# Patient Record
Sex: Female | Born: 1970 | Race: Black or African American | Hispanic: No | Marital: Single | Smoking: Never smoker
Health system: Southern US, Community
[De-identification: ages and names within clinical notes are randomized; demographics above are authoritative.]

## PROBLEM LIST (undated history)

## (undated) DIAGNOSIS — I10 Essential (primary) hypertension: Secondary | ICD-10-CM

## (undated) DIAGNOSIS — M1990 Unspecified osteoarthritis, unspecified site: Secondary | ICD-10-CM

## (undated) HISTORY — DX: Essential (primary) hypertension: I10

## (undated) HISTORY — DX: Unspecified osteoarthritis, unspecified site: M19.90

---

## 2019-07-15 DIAGNOSIS — Z7189 Other specified counseling: Secondary | ICD-10-CM | POA: Diagnosis not present

## 2019-07-15 DIAGNOSIS — Z20828 Contact with and (suspected) exposure to other viral communicable diseases: Secondary | ICD-10-CM | POA: Diagnosis not present

## 2020-03-21 ENCOUNTER — Other Ambulatory Visit: Payer: Self-pay

## 2020-03-21 ENCOUNTER — Ambulatory Visit (INDEPENDENT_AMBULATORY_CARE_PROVIDER_SITE_OTHER): Payer: Federal, State, Local not specified - PPO

## 2020-03-21 ENCOUNTER — Encounter: Payer: Self-pay | Admitting: Internal Medicine

## 2020-03-21 ENCOUNTER — Ambulatory Visit: Payer: Federal, State, Local not specified - PPO | Admitting: Internal Medicine

## 2020-03-21 VITALS — BP 146/102 | HR 76 | Temp 98.4°F | Resp 16 | Ht 67.0 in | Wt 271.8 lb

## 2020-03-21 DIAGNOSIS — M1732 Unilateral post-traumatic osteoarthritis, left knee: Secondary | ICD-10-CM | POA: Diagnosis not present

## 2020-03-21 DIAGNOSIS — M25562 Pain in left knee: Secondary | ICD-10-CM

## 2020-03-21 DIAGNOSIS — E559 Vitamin D deficiency, unspecified: Secondary | ICD-10-CM

## 2020-03-21 DIAGNOSIS — Z1159 Encounter for screening for other viral diseases: Secondary | ICD-10-CM

## 2020-03-21 DIAGNOSIS — Z124 Encounter for screening for malignant neoplasm of cervix: Secondary | ICD-10-CM | POA: Insufficient documentation

## 2020-03-21 DIAGNOSIS — Z0001 Encounter for general adult medical examination with abnormal findings: Secondary | ICD-10-CM

## 2020-03-21 DIAGNOSIS — G8929 Other chronic pain: Secondary | ICD-10-CM | POA: Diagnosis not present

## 2020-03-21 DIAGNOSIS — Z0000 Encounter for general adult medical examination without abnormal findings: Secondary | ICD-10-CM | POA: Diagnosis not present

## 2020-03-21 DIAGNOSIS — M1712 Unilateral primary osteoarthritis, left knee: Secondary | ICD-10-CM | POA: Diagnosis not present

## 2020-03-21 DIAGNOSIS — Z23 Encounter for immunization: Secondary | ICD-10-CM | POA: Diagnosis not present

## 2020-03-21 DIAGNOSIS — I10 Essential (primary) hypertension: Secondary | ICD-10-CM

## 2020-03-21 LAB — CBC WITH DIFFERENTIAL/PLATELET
Absolute Monocytes: 518 cells/uL (ref 200–950)
Basophils Absolute: 49 cells/uL (ref 0–200)
Basophils Relative: 0.6 %
Eosinophils Absolute: 211 cells/uL (ref 15–500)
Eosinophils Relative: 2.6 %
HCT: 39.7 % (ref 35.0–45.0)
Hemoglobin: 13.3 g/dL (ref 11.7–15.5)
Lymphs Abs: 2333 cells/uL (ref 850–3900)
MCH: 28.5 pg (ref 27.0–33.0)
MCHC: 33.5 g/dL (ref 32.0–36.0)
MCV: 85.2 fL (ref 80.0–100.0)
MPV: 10.7 fL (ref 7.5–12.5)
Monocytes Relative: 6.4 %
Neutro Abs: 4990 cells/uL (ref 1500–7800)
Neutrophils Relative %: 61.6 %
Platelets: 230 10*3/uL (ref 140–400)
RBC: 4.66 10*6/uL (ref 3.80–5.10)
RDW: 13.3 % (ref 11.0–15.0)
Total Lymphocyte: 28.8 %
WBC: 8.1 10*3/uL (ref 3.8–10.8)

## 2020-03-21 NOTE — Progress Notes (Signed)
Subjective:  Patient ID: Kelly Vega, female    DOB: January 26, 1971  Age: 49 y.o. MRN: [id number]  CC: Annual Exam, Hypertension, and Osteoarthritis  This visit occurred during the SARS-CoV-2 public health emergency.  Safety protocols were in place, including screening questions prior to the visit, additional usage of staff PPE, and extensive cleaning of exam room while observing appropriate contact time as indicated for disinfecting solutions.   NEW TO ME  HPI Kelly Vega presents for a CPX.  She complains of a 1 year history of worsening left knee pain with decreased range of motion.  She has had several knee injuries throughout her life.  She denies any recent trauma or injury.  The knee never gets swollen.  None of her other joints bother her.  She has not been taking anything to control the discomfort.  She is active and denies any recent episodes of headache, blurred vision, chest pain, shortness of breath, palpitations, edema, or fatigue.  History Kelly Vega has no past medical history on file.   She has no past surgical history on file.   Her family history includes Alcoholism in her father; Breast cancer in her mother; CAD in her father; Diabetes in her mother; Hypertension in her father and mother.She reports that she has never smoked. She has never used smokeless tobacco. She reports previous alcohol use. She reports previous drug use.  No outpatient medications prior to visit.   No facility-administered medications prior to visit.    ROS Review of Systems  Constitutional: Negative.  Negative for appetite change, chills, fatigue and fever.  HENT: Negative.   Eyes: Negative.   Respiratory: Negative for cough, chest tightness, shortness of breath and wheezing.   Cardiovascular: Negative for chest pain, palpitations and leg swelling.  Gastrointestinal: Negative for abdominal pain, constipation, diarrhea, nausea and vomiting.  Endocrine: Negative.   Genitourinary: Negative.     Musculoskeletal: Positive for arthralgias. Negative for back pain, myalgias and neck pain.  Skin: Negative.  Negative for color change, pallor and rash.  Neurological: Negative.  Negative for dizziness, weakness, numbness and headaches.  Hematological: Negative for adenopathy. Does not bruise/bleed easily.  Psychiatric/Behavioral: Negative.     Objective:  BP (!) 146/102 Comment: BP (R) 146/102 (L) 144/92  Pulse 76   Temp 98.4 F (36.9 C) (Oral)   Resp 16   Ht 5\' 7"  (1.702 m)   Wt 271 lb 12.8 oz (123.3 kg)   SpO2 98%   BMI 42.57 kg/m   Physical Exam Vitals reviewed.  Constitutional:      Appearance: Normal appearance. She is obese.  HENT:     Nose: Nose normal.     Mouth/Throat:     Mouth: Mucous membranes are moist.  Eyes:     General: No scleral icterus.    Conjunctiva/sclera: Conjunctivae normal.  Cardiovascular:     Rate and Rhythm: Normal rate and regular rhythm.     Heart sounds: Normal heart sounds, S1 normal and S2 normal. No murmur heard.      Comments: EKG- NSR, 68 bpm Low voltage c/w body habitus No LVH Otherwise normal EKG Pulmonary:     Effort: Pulmonary effort is normal.     Breath sounds: No stridor. No wheezing, rhonchi or rales.  Abdominal:     General: Abdomen is protuberant. Bowel sounds are normal. There is no distension.     Palpations: Abdomen is soft. There is no hepatomegaly, splenomegaly or mass.  Musculoskeletal:        General:  Deformity (DJD) present.     Cervical back: Neck supple.     Right lower leg: No edema.     Left lower leg: No edema.  Lymphadenopathy:     Cervical: No cervical adenopathy.  Skin:    General: Skin is warm and dry.     Coloration: Skin is not pale.  Neurological:     General: No focal deficit present.     Mental Status: She is alert.  Psychiatric:        Mood and Affect: Mood normal.        Behavior: Behavior normal.      Lab Results  Component Value Date   WBC 8.1 03/21/2020   HGB 13.3 03/21/2020    HCT 39.7 03/21/2020   PLT 230 03/21/2020   GLUCOSE 87 03/21/2020   CHOL 178 03/21/2020   TRIG 91 03/21/2020   HDL 48 (L) 03/21/2020   LDLCALC 111 (H) 03/21/2020   ALT 16 03/21/2020   AST 19 03/21/2020   NA 139 03/21/2020   K 4.2 03/21/2020   CL 105 03/21/2020   CREATININE 0.79 03/21/2020   BUN 12 03/21/2020   CO2 27 03/21/2020   TSH 1.44 03/21/2020   HGBA1C 5.7 (H) 03/21/2020   DG Knee Complete 4 Views Left  Result Date: 03/21/2020 CLINICAL DATA:  Generalized chronic left knee pain. No reported injury. EXAM: LEFT KNEE - COMPLETE 4+ VIEW COMPARISON:  None. FINDINGS: No fracture, joint effusion or dislocation. Small superior and inferior left patellar enthesophytes. Mild tricompartmental left knee osteoarthritis, most prominent in the lateral compartment. No focal osseous lesions. No radiopaque foreign bodies. IMPRESSION: Mild tricompartmental left knee osteoarthritis, most prominent in the lateral compartment. Electronically Signed   By: Delbert Phenix M.D.   On: 03/21/2020 16:36     Assessment & Plan:   [place] was seen today for annual exam, hypertension and osteoarthritis.  Diagnoses and all orders for this visit:  Routine general medical examination at a health care facility- Exam completed, labs reviewed- She does not have an elevated ASCVD risk or so I did not recommend a statin for CV risk reduction, vaccines reviewed and updated, she is referred for cervical cancer screening, patient education was given. -     Lipid panel; Future -     HIV Antibody (routine testing w rflx); Future -     HIV Antibody (routine testing w rflx) -     Lipid panel  Need for hepatitis C screening test -     Hepatitis C antibody; Future -     Hepatitis C antibody  Cervical cancer screening -     Ambulatory referral to Gynecology  Morbid obesity (HCC)- Labs are negative for secondary causes. -     Hemoglobin A1c; Future -     Hepatic function panel; Future -     TSH; Future -     TSH -      Hepatic function panel -     Hemoglobin A1c  Essential hypertension- Her labs are negative for secondary causes or endorgan damage.  I recommended that she treat this with a thiazide diuretic in addition to lifestyle modifications. -     CBC with Differential/Platelet; Future -     BASIC METABOLIC PANEL WITH GFR; Future -     Urinalysis, Routine w reflex microscopic; Future -     VITAMIN D 25 Hydroxy (Vit-D Deficiency, Fractures); Future -     hCG, quantitative, pregnancy; Future -     hCG,  quantitative, pregnancy -     VITAMIN D 25 Hydroxy (Vit-D Deficiency, Fractures) -     Urinalysis, Routine w reflex microscopic -     BASIC METABOLIC PANEL WITH GFR -     CBC with Differential/Platelet -     EKG 12-Lead -     indapamide (LOZOL) 1.25 MG tablet; Take 1 tablet (1.25 mg total) by mouth daily.  Chronic pain of left knee -     DG Knee Complete 4 Views Left; Future -     Ambulatory referral to Sports Medicine  Post-traumatic osteoarthritis of left knee -     Ambulatory referral to Sports Medicine  Vitamin D insufficiency -     Cholecalciferol 50 MCG (2000 UT) TABS; Take 1 tablet (2,000 Units total) by mouth daily.  Other orders -     Tdap vaccine greater than or equal to 7yo IM   I am having Farrell Ours start on Cholecalciferol and indapamide.  Meds ordered this encounter  Medications  . Cholecalciferol 50 MCG (2000 UT) TABS    Sig: Take 1 tablet (2,000 Units total) by mouth daily.    Dispense:  90 tablet    Refill:  1  . indapamide (LOZOL) 1.25 MG tablet    Sig: Take 1 tablet (1.25 mg total) by mouth daily.    Dispense:  90 tablet    Refill:  0     Follow-up: Return in about 6 weeks (around 05/02/2020).  Sanda Linger, MD

## 2020-03-21 NOTE — Patient Instructions (Signed)

## 2020-03-22 DIAGNOSIS — E559 Vitamin D deficiency, unspecified: Secondary | ICD-10-CM | POA: Insufficient documentation

## 2020-03-22 LAB — BASIC METABOLIC PANEL WITH GFR
BUN: 12 mg/dL (ref 7–25)
CO2: 27 mmol/L (ref 20–32)
Calcium: 9 mg/dL (ref 8.6–10.2)
Chloride: 105 mmol/L (ref 98–110)
Creat: 0.79 mg/dL (ref 0.50–1.10)
GFR, Est African American: 103 mL/min/{1.73_m2} (ref >=60)
GFR, Est Non African American: 89 mL/min/{1.73_m2} (ref >=60)
Glucose, Bld: 87 mg/dL (ref 65–99)
Potassium: 4.2 mmol/L (ref 3.5–5.3)
Sodium: 139 mmol/L (ref 135–146)

## 2020-03-22 LAB — URINALYSIS, ROUTINE W REFLEX MICROSCOPIC
Bacteria, UA: NONE SEEN /HPF
Bilirubin Urine: NEGATIVE
Glucose, UA: NEGATIVE
Hgb urine dipstick: NEGATIVE
Hyaline Cast: NONE SEEN /LPF
Ketones, ur: NEGATIVE
Leukocytes,Ua: NEGATIVE
Nitrite: NEGATIVE
Protein, ur: NEGATIVE
Specific Gravity, Urine: 1.029 (ref 1.001–1.03)
pH: 5.5 (ref 5.0–8.0)

## 2020-03-22 LAB — HEPATIC FUNCTION PANEL
AG Ratio: 1.2 (calc) (ref 1.0–2.5)
ALT: 16 U/L (ref 6–29)
AST: 19 U/L (ref 10–35)
Albumin: 4.1 g/dL (ref 3.6–5.1)
Alkaline phosphatase (APISO): 84 U/L (ref 31–125)
Bilirubin, Direct: 0.1 mg/dL (ref 0.0–0.2)
Globulin: 3.5 g/dL (calc) (ref 1.9–3.7)
Indirect Bilirubin: 0.2 mg/dL (calc) (ref 0.2–1.2)
Total Bilirubin: 0.3 mg/dL (ref 0.2–1.2)
Total Protein: 7.6 g/dL (ref 6.1–8.1)

## 2020-03-22 LAB — LIPID PANEL
Cholesterol: 178 mg/dL (ref <200)
HDL: 48 mg/dL — ABNORMAL LOW (ref >=50)
LDL Cholesterol (Calc): 111 mg/dL (calc) — ABNORMAL HIGH
Non-HDL Cholesterol (Calc): 130 mg/dL (calc) — ABNORMAL HIGH (ref <130)
Total CHOL/HDL Ratio: 3.7 (calc) (ref <5.0)
Triglycerides: 91 mg/dL (ref <150)

## 2020-03-22 LAB — HEPATITIS C ANTIBODY
Hepatitis C Ab: NONREACTIVE
SIGNAL TO CUT-OFF: 0.02 (ref <1.00)

## 2020-03-22 LAB — HCG, QUANTITATIVE, PREGNANCY: HCG, Total, QN: 3 m[IU]/mL

## 2020-03-22 LAB — VITAMIN D 25 HYDROXY (VIT D DEFICIENCY, FRACTURES): Vit D, 25-Hydroxy: 23 ng/mL — ABNORMAL LOW (ref 30–100)

## 2020-03-22 LAB — HIV ANTIBODY (ROUTINE TESTING W REFLEX): HIV 1&2 Ab, 4th Generation: NONREACTIVE

## 2020-03-22 LAB — HEMOGLOBIN A1C
Hgb A1c MFr Bld: 5.7 % of total Hgb — ABNORMAL HIGH (ref <5.7)
Mean Plasma Glucose: 117 (calc)
eAG (mmol/L): 6.5 (calc)

## 2020-03-22 LAB — TSH: TSH: 1.44 mIU/L

## 2020-03-22 MED ORDER — INDAPAMIDE 1.25 MG PO TABS: 1.2500 mg | ORAL_TABLET | Freq: Every day | ORAL | 0 refills | Status: DC

## 2020-03-22 MED ORDER — CHOLECALCIFEROL 50 MCG (2000 UT) PO TABS: 1.0000 | ORAL_TABLET | Freq: Every day | ORAL | 1 refills | Status: DC

## 2020-03-30 ENCOUNTER — Encounter: Payer: Self-pay | Admitting: Family Medicine

## 2020-03-30 ENCOUNTER — Ambulatory Visit: Payer: Federal, State, Local not specified - PPO | Admitting: Family Medicine

## 2020-03-30 ENCOUNTER — Other Ambulatory Visit: Payer: Self-pay

## 2020-03-30 ENCOUNTER — Ambulatory Visit (INDEPENDENT_AMBULATORY_CARE_PROVIDER_SITE_OTHER): Payer: Federal, State, Local not specified - PPO

## 2020-03-30 ENCOUNTER — Ambulatory Visit: Payer: Self-pay

## 2020-03-30 VITALS — BP 140/100 | HR 87 | Ht 67.0 in | Wt 270.0 lb

## 2020-03-30 DIAGNOSIS — M25562 Pain in left knee: Secondary | ICD-10-CM

## 2020-03-30 DIAGNOSIS — M5416 Radiculopathy, lumbar region: Secondary | ICD-10-CM

## 2020-03-30 DIAGNOSIS — M1732 Unilateral post-traumatic osteoarthritis, left knee: Secondary | ICD-10-CM | POA: Diagnosis not present

## 2020-03-30 DIAGNOSIS — M545 Low back pain: Secondary | ICD-10-CM | POA: Diagnosis not present

## 2020-03-30 NOTE — Patient Instructions (Addendum)
Thank you for coming in today.  Plan for xray today.  Plan for limited PT.  Try Voltaren gel on the knee.   Can use gabapentin for nerve pain or even prednisone but I would like to avoid.    Radicular Pain Radicular pain is a type of pain that spreads from your back or neck along a spinal nerve. Spinal nerves are nerves that leave the spinal cord and go to the muscles. Radicular pain is sometimes called radiculopathy, radiculitis, or a pinched nerve. When you have this type of pain, you may also have weakness, numbness, or tingling in the area of your body that is supplied by the nerve. The pain may feel sharp and burning. Depending on which spinal nerve is affected, the pain may occur in the:  Neck area (cervical radicular pain). You may also feel pain, numbness, weakness, or tingling in the arms.  Mid-spine area (thoracic radicular pain). You would feel this pain in the back and chest. This type is rare.  Lower back area (lumbar radicular pain). You would feel this pain as low back pain. You may feel pain, numbness, weakness, or tingling in the buttocks or legs. Sciatica is a type of lumbar radicular pain that shoots down the back of the leg. Radicular pain occurs when one of the spinal nerves becomes irritated or squeezed (compressed). It is often caused by something pushing on a spinal nerve, such as one of the bones of the spine (vertebrae) or one of the round cushions between vertebrae (intervertebral disks). This can result from:  An injury.  Wear and tear or aging of a disk.  The growth of a bone spur that pushes on the nerve. Radicular pain often goes away when you follow instructions from your health care provider for relieving pain at home. Follow these instructions at home: Managing pain      If directed, put ice on the affected area: ? Put ice in a plastic bag. ? Place a towel between your skin and the bag. ? Leave the ice on for 20 minutes, 2-3 times a day.  If  directed, apply heat to the affected area as often as told by your health care provider. Use the heat source that your health care provider recommends, such as a moist heat pack or a heating pad. ? Place a towel between your skin and the heat source. ? Leave the heat on for 20-30 minutes. ? Remove the heat if your skin turns bright red. This is especially important if you are unable to feel pain, heat, or cold. You may have a greater risk of getting burned. Activity   Do not sit or rest in bed for long periods of time.  Try to stay as active as possible. Ask your health care provider what type of exercise or activity is best for you.  Avoid activities that make your pain worse, such as bending and lifting.  Do not lift anything that is heavier than 10 lb (4.5 kg), or the limit that you are told, until your health care provider says that it is safe.  Practice using proper technique when lifting items. Proper lifting technique involves bending your knees and rising up.  Do strength and range-of-motion exercises only as told by your health care provider or physical therapist. General instructions  Take over-the-counter and prescription medicines only as told by your health care provider.  Pay attention to any changes in your symptoms.  Keep all follow-up visits as told by your  health care provider. This is important. ? Your health care provider may send you to a physical therapist to help with this pain. Contact a health care provider if:  Your pain and other symptoms get worse.  Your pain medicine is not helping.  Your pain has not improved after a few weeks of home care.  You have a fever. Get help right away if:  You have severe pain, weakness, or numbness.  You have difficulty with bladder or bowel control. Summary  Radicular pain is a type of pain that spreads from your back or neck along a spinal nerve.  When you have radicular pain, you may also have weakness,  numbness, or tingling in the area of your body that is supplied by the nerve.  The pain may feel sharp or burning.  Radicular pain may be treated with ice, heat, medicines, or physical therapy. This information is not intended to replace advice given to you by your health care provider. Make sure you discuss any questions you have with your health care provider. Document Revised: 01/21/2018 Document Reviewed: 01/21/2018 Elsevier Patient Education  2020 [employer].
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
## 2020-03-30 NOTE — Progress Notes (Signed)
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  Some shifting of the vertebrae is present due to some of the arthritis

## 2020-04-06 ENCOUNTER — Ambulatory Visit (HOSPITAL_COMMUNITY): Payer: Federal, State, Local not specified - PPO | Attending: Family Medicine | Admitting: Physical Therapy

## 2020-04-06 ENCOUNTER — Other Ambulatory Visit: Payer: Self-pay

## 2020-04-06 ENCOUNTER — Encounter (HOSPITAL_COMMUNITY): Payer: Self-pay | Admitting: Physical Therapy

## 2020-04-06 DIAGNOSIS — M6281 Muscle weakness (generalized): Secondary | ICD-10-CM | POA: Diagnosis not present

## 2020-04-06 DIAGNOSIS — M79662 Pain in left lower leg: Secondary | ICD-10-CM | POA: Insufficient documentation

## 2020-04-06 DIAGNOSIS — M545 Low back pain, unspecified: Secondary | ICD-10-CM

## 2020-04-06 DIAGNOSIS — R29898 Other symptoms and signs involving the musculoskeletal system: Secondary | ICD-10-CM | POA: Insufficient documentation

## 2020-04-06 DIAGNOSIS — R2689 Other abnormalities of gait and mobility: Secondary | ICD-10-CM | POA: Diagnosis not present

## 2020-04-06 NOTE — Therapy (Signed)
[facility] [address] [place], [place], [address] Phone: [phone]   Fax:  [phone]  Physical Therapy Evaluation  Patient Details  Name: Kelly Vega MRN: [id number] Date of Birth: 1971-03-05 Referring Provider (PT): Clementeen Graham MD   Encounter Date: 04/06/2020   PT End of Session - 04/06/20 1559    Visit Number 1    Number of Visits 8    Date for PT Re-Evaluation 05/04/20    Authorization Type BCBS/ Federal employee PPO (visit limit 50 PT/OT/SLP, no auth)    Authorization - Visit Number 1    Authorization - Number of Visits 50    Progress Note Due on Visit 10    PT Start Time [id number]    PT Stop Time 1029    PT Time Calculation (min) 42 min    Activity Tolerance Patient tolerated treatment well    Behavior During Therapy [facility] for tasks assessed/performed           History reviewed. No pertinent past medical history.  History reviewed. No pertinent surgical history.  There were no vitals filed for this visit.    Subjective Assessment - 04/06/20 0955    Subjective Patient is a 49 y.o. female who presents to physical therapy with c/o L knee pain and lumbar radiculopathy. Patient states her L leg gets really tight and weak with walking. She states pain in her calf and front of her leg. Her leg gets heavy. She has history of low back pain and is not sure if this is related. Pain aggravated with walking in [place], cooking, standing for several hours. Symptoms are better with rest. Her main goal is to avoid medications and fix her symptoms. Symptoms began December 2020 with insidious onset.    Limitations Standing;Walking;House hold activities    How long can you walk comfortably? 1 hour before symptoms start when walking in [employer]    Patient Stated Goals avoid medications and fix her symptoms    Currently in Pain? No/denies              [facility] PT Assessment - 04/06/20 0001      Assessment   Medical Diagnosis L knee pain,  Lumbar Radiculopathy    Referring Provider (PT) Clementeen Graham MD    Onset Date/Surgical Date 06/23/19    Next MD Visit Oct 20    Prior Therapy none      Precautions   Precautions None      Restrictions   Weight Bearing Restrictions No      Balance Screen   Has the patient fallen in the past 6 months No    Has the patient had a decrease in activity level because of a fear of falling?  No    Is the patient reluctant to leave their home because of a fear of falling?  No      Prior Function   Level of Independence Independent    Vocation Full time employment    Vocation Requirements [employer]      Cognition   Overall Cognitive Status Within Functional Limits for tasks assessed      Observation/Other Assessments   Observations Ambulates without AD, L ankle edema laterally    Focus on Therapeutic Outcomes (FOTO)  not completed today      Sensation   Light Touch Appears Intact      Posture/Postural Control   Posture/Postural Control Postural limitations    Postural Limitations Increased lumbar lordosis  ROM / Strength   AROM / PROM / Strength AROM;Strength      AROM   AROM Assessment Site Lumbar;Knee    Right/Left Knee Right;Left    Right Knee Extension 0    Right Knee Flexion 120    Left Knee Extension 0    Left Knee Flexion 120    Lumbar Flexion 25% limited    Lumbar Extension 0% limited    Lumbar - Right Side Bend 0% limited    Lumbar - Left Side Bend 0% limited    Lumbar - Right Rotation 0% limited    Lumbar - Left Rotation 0% limited      Strength   Strength Assessment Site Hip;Knee;Ankle    Right/Left Hip Right;Left    Right Hip Flexion 4+/5    Left Hip Flexion 4+/5    Right/Left Knee Right;Left    Right Knee Flexion 5/5    Right Knee Extension 5/5    Left Knee Flexion 5/5    Left Knee Extension 5/5    Right/Left Ankle Right;Left    Right Ankle Dorsiflexion 5/5    Left Ankle Dorsiflexion 5/5      Palpation   Spinal mobility grossly hypomobile     Palpation comment Not tenderness in back or LLE      Special Tests   Other special tests negative femoral nerve tension bilateral, negative slump bilateral                      Objective measurements completed on examination: See above findings.               PT Education - 04/06/20 0958    Education Details Patient edcated on exam findings, POC, scope of PT    Person(s) Educated Patient    Methods Explanation;Demonstration    Comprehension Verbalized understanding;Returned demonstration            PT Short Term Goals - 04/06/20 1611      PT SHORT TERM GOAL #1   Title Patient will be independent with HEP in order to improve functional outcomes.    Time 2    Period Weeks    Status New    Target Date 04/20/20      PT SHORT TERM GOAL #2   Title Patient will report at least 25% improvement in symptoms for improved quality of life.    Time 2    Period Weeks    Status New    Target Date 04/20/20             PT Long Term Goals - 04/06/20 1612      PT LONG TERM GOAL #1   Title Patient will report at least 75% improvement in symptoms for improved quality of life.    Time 4    Period Weeks    Status New    Target Date 05/04/20      PT LONG TERM GOAL #2   Title Patient will be able to walk unrestricted through [employer] without increase in symptoms for improved community ambualtion.    Time 4    Period Weeks    Status New    Target Date 05/04/20                  Plan - 04/06/20 1601    Clinical Impression Statement Patient is a 49 y.o. female who presents to physical therapy with referral for  L knee pain and lumbar radiculopathy. Unable to elicit  symptoms today with all testing and symptoms typically begin after extended periods of walking at [facility]. Unable to fully conclude today if symptoms are related to lumbar spine or LLE pathology. She presents with deficits in strength, ROM, endurance, postural impairments, gait, LLE edema, spinal  mobility and functional mobility with ADL. She is having to modify and restrict ADL as indicated subjective information and objective measures which is affecting overall participation. Patient will benefit from skilled physical therapy in order to improve function and reduce impairment.    Personal Factors and Comorbidities Comorbidity 2;Fitness;Time since onset of injury/illness/exacerbation    Comorbidities Obesity, HTN    Examination-Activity Limitations Locomotion Level;Transfers;Stand;Stairs    Examination-Participation Restrictions Community Activity;Driving;Shop;Volunteer    Stability/Clinical Decision Making Evolving/Moderate complexity    Clinical Decision Making Moderate    Rehab Potential Fair    PT Frequency 2x / week    PT Duration 4 weeks    PT Treatment/Interventions ADLs/Self Care Home Management;Aquatic Therapy;Cryotherapy;Electrical Stimulation;Moist Heat;Traction;Ultrasound;DME Instruction;Gait training;Stair training;Functional mobility training;Therapeutic activities;Therapeutic exercise;Balance training;Neuromuscular re-education;Patient/family education;Orthotic Fit/Training;Manual techniques;Manual lymph drainage;Compression bandaging;Scar mobilization;Passive range of motion;Dry needling;Energy conservation;Splinting;Taping;Spinal Manipulations;Joint Manipulations    PT Next Visit Plan Further assess LLE, begin core and hip strengthening, assess repeated motions, possibly assess walking to elicit symptoms.    PT Home Exercise Plan initiate next session    Consulted and Agree with Plan of Care Patient           Patient will benefit from skilled therapeutic intervention in order to improve the following deficits and impairments:  Abnormal gait, Decreased activity tolerance, Decreased range of motion, Difficulty walking, Pain  Visit Diagnosis: Low back pain, unspecified back pain laterality, unspecified chronicity, unspecified whether sciatica present  Pain in left lower  leg  Muscle weakness (generalized)  Other abnormalities of gait and mobility  Other symptoms and signs involving the musculoskeletal system     Problem List Patient Active Problem List   Diagnosis Date Noted  . Vitamin D insufficiency 03/22/2020  . Need for hepatitis C screening test 03/21/2020  . Routine general medical examination at a health care facility 03/21/2020  . Cervical cancer screening 03/21/2020  . Morbid obesity (HCC) 03/21/2020  . Essential hypertension 03/21/2020  . Chronic pain of left knee 03/21/2020  . Post-traumatic osteoarthritis of left knee 03/21/2020   4:14 PM, 04/06/20 Wyman Songster PT, DPT Physical Therapist at [facility]   [facility] [address] [place], [place], [address] Phone: [phone]   Fax:  [phone]  Name: Kelly Vega MRN: [id number] Date of Birth: 02-Sep-1970

## 2020-04-07 ENCOUNTER — Telehealth (HOSPITAL_COMMUNITY): Payer: Self-pay | Admitting: Physical Therapy

## 2020-04-07 NOTE — Telephone Encounter (Signed)
Called pt back to help with schedule changes - no answer and no voicemail.

## 2020-04-13 ENCOUNTER — Encounter (HOSPITAL_COMMUNITY): Payer: Self-pay | Admitting: Physical Therapy

## 2020-04-13 ENCOUNTER — Ambulatory Visit (HOSPITAL_COMMUNITY): Payer: Federal, State, Local not specified - PPO | Admitting: Physical Therapy

## 2020-04-13 ENCOUNTER — Other Ambulatory Visit: Payer: Self-pay

## 2020-04-13 DIAGNOSIS — M545 Low back pain, unspecified: Secondary | ICD-10-CM

## 2020-04-13 DIAGNOSIS — R2689 Other abnormalities of gait and mobility: Secondary | ICD-10-CM

## 2020-04-13 DIAGNOSIS — M6281 Muscle weakness (generalized): Secondary | ICD-10-CM | POA: Diagnosis not present

## 2020-04-13 DIAGNOSIS — R29898 Other symptoms and signs involving the musculoskeletal system: Secondary | ICD-10-CM | POA: Diagnosis not present

## 2020-04-13 DIAGNOSIS — M79662 Pain in left lower leg: Secondary | ICD-10-CM | POA: Diagnosis not present

## 2020-04-13 NOTE — Therapy (Signed)
[facility] [address] [place], [place], [address] Phone: [phone]   Fax:  [phone]  Physical Therapy Treatment  Patient Details  Name: Kelly Vega MRN: [id number] Date of Birth: 09/14/1970 Referring Provider (PT): Clementeen Graham MD   Encounter Date: 04/13/2020   PT End of Session - 04/13/20 1043    Visit Number 2    Number of Visits 8    Date for PT Re-Evaluation 05/04/20    Authorization Type BCBS/ Federal employee PPO (visit limit 50 PT/OT/SLP, no auth)    Authorization - Visit Number 2    Authorization - Number of Visits 50    Progress Note Due on Visit 10    PT Start Time 1047    PT Stop Time 1127    PT Time Calculation (min) 40 min    Activity Tolerance Patient tolerated treatment well    Behavior During Therapy [facility] for tasks assessed/performed           History reviewed. No pertinent past medical history.  History reviewed. No pertinent surgical history.  There were no vitals filed for this visit.   Subjective Assessment - 04/13/20 1043    Subjective Patient states she has not gone to [employer] this week so she has not had the tightness in her leg. She did notice some knee pain. Tightness is not in foot. She walks for 30-60 minutes in [employer] before symptoms come on. Other errands she is not in the store as long.    Limitations Standing;Walking;House hold activities    How long can you walk comfortably? 1 hour before symptoms start when walking in [employer]    Patient Stated Goals avoid medications and fix her symptoms    Currently in Pain? No/denies              [facility] PT Assessment - 04/13/20 0001      AROM   AROM Assessment Site Ankle    Right/Left Ankle Right;Left    Right Ankle Dorsiflexion 3    Left Ankle Dorsiflexion 5      Strength   Right Hip Extension 4+/5    Right Hip ABduction 4+/5    Left Hip Extension 4+/5    Left Hip ABduction 4+/5                         [facility] Adult PT  Treatment/Exercise - 04/13/20 0001      Exercises   Exercises Knee/Hip      Knee/Hip Exercises: Stretches   Gastroc Stretch Both;3 reps;30 seconds    Gastroc Stretch Limitations slant board and at counter      Knee/Hip Exercises: Standing   Heel Raises 2 sets;15 reps;Both    Heel Raises Limitations toe raises 2x15      Knee/Hip Exercises: Seated   Sit to Sand 3 sets;10 reps;without UE support      Knee/Hip Exercises: Sidelying   Hip ABduction Both;2 sets;10 reps      Knee/Hip Exercises: Prone   Hip Extension 2 sets;Both;10 reps                  PT Education - 04/13/20 1043    Education Details Patient educated on HEP, exercise mechanics    Person(s) Educated Patient    Methods Explanation;Demonstration    Comprehension Verbalized understanding;Returned demonstration            PT Short Term Goals - 04/06/20 1611  PT SHORT TERM GOAL #1   Title Patient will be independent with HEP in order to improve functional outcomes.    Time 2    Period Weeks    Status New    Target Date 04/20/20      PT SHORT TERM GOAL #2   Title Patient will report at least 25% improvement in symptoms for improved quality of life.    Time 2    Period Weeks    Status New    Target Date 04/20/20             PT Long Term Goals - 04/06/20 1612      PT LONG TERM GOAL #1   Title Patient will report at least 75% improvement in symptoms for improved quality of life.    Time 4    Period Weeks    Status New    Target Date 05/04/20      PT LONG TERM GOAL #2   Title Patient will be able to walk unrestricted through [employer] without increase in symptoms for improved community ambualtion.    Time 4    Period Weeks    Status New    Target Date 05/04/20                 Plan - 04/13/20 1044    Clinical Impression Statement Patient has not had flare up of symptoms that she was originally being seen for since she has not had her usual [employer] trip. Symptoms typically only  begin after about 1 hour at [facility]. Patient shows shortened calf musculature today and calf stretch added which patient states feels good. Patient uses minimal UE support for balance with heel raise exercise. Patient fatigues quickly with ankle exercises. She requires verbal cueing for eccentric control with STS exercise with good carry over. Patient showing good hip strength but fatigues quickly with hip strengthening exercises against gravity. She requires frequent cueing for controlled, slow movements throughout session. Patient states concordant symptoms in LLE with hip abduction in sidelying with fatigue. Patient symptoms may be related to lack of endurance and activity tolerance. Patient will continue to benefit from skilled physical therapy in order to reduce impairment and improve function.    Personal Factors and Comorbidities Comorbidity 2;Fitness;Time since onset of injury/illness/exacerbation    Comorbidities Obesity, HTN    Examination-Activity Limitations Locomotion Level;Transfers;Stand;Stairs    Examination-Participation Restrictions Community Activity;Driving;Shop;Volunteer    Stability/Clinical Decision Making Evolving/Moderate complexity    Rehab Potential Fair    PT Frequency 2x / week    PT Duration 4 weeks    PT Treatment/Interventions ADLs/Self Care Home Management;Aquatic Therapy;Cryotherapy;Electrical Stimulation;Moist Heat;Traction;Ultrasound;DME Instruction;Gait training;Stair training;Functional mobility training;Therapeutic activities;Therapeutic exercise;Balance training;Neuromuscular re-education;Patient/family education;Orthotic Fit/Training;Manual techniques;Manual lymph drainage;Compression bandaging;Scar mobilization;Passive range of motion;Dry needling;Energy conservation;Splinting;Taping;Spinal Manipulations;Joint Manipulations    PT Next Visit Plan continue core and hip strengthening, continue bilateral LE strengtheing to build endurance,  Possibily assess repeated  motions, possibly assess walking to elicit symptoms.    PT Home Exercise Plan 9/22 calf stretch, heel and toe raises, sit to stand    Consulted and Agree with Plan of Care Patient           Patient will benefit from skilled therapeutic intervention in order to improve the following deficits and impairments:  Abnormal gait, Decreased activity tolerance, Decreased range of motion, Difficulty walking, Pain  Visit Diagnosis: Low back pain, unspecified back pain laterality, unspecified chronicity, unspecified whether sciatica present  Pain in left lower leg  Muscle weakness (  generalized)  Other abnormalities of gait and mobility  Other symptoms and signs involving the musculoskeletal system     Problem List Patient Active Problem List   Diagnosis Date Noted  . Vitamin D insufficiency 03/22/2020  . Need for hepatitis C screening test 03/21/2020  . Routine general medical examination at a health care facility 03/21/2020  . Cervical cancer screening 03/21/2020  . Morbid obesity (HCC) 03/21/2020  . Essential hypertension 03/21/2020  . Chronic pain of left knee 03/21/2020  . Post-traumatic osteoarthritis of left knee 03/21/2020    11:33 AM, 04/13/20 Wyman Songster PT, DPT Physical Therapist at [facility]   [facility] [address] [place], [place], [address] Phone: [phone]   Fax:  [phone]  Name: Kelly Vega MRN: [id number] Date of Birth: 20-Mar-1971

## 2020-04-14 ENCOUNTER — Ambulatory Visit (HOSPITAL_COMMUNITY): Payer: Federal, State, Local not specified - PPO | Admitting: Physical Therapy

## 2020-04-14 DIAGNOSIS — M545 Low back pain, unspecified: Secondary | ICD-10-CM

## 2020-04-14 DIAGNOSIS — R29898 Other symptoms and signs involving the musculoskeletal system: Secondary | ICD-10-CM | POA: Diagnosis not present

## 2020-04-14 DIAGNOSIS — M6281 Muscle weakness (generalized): Secondary | ICD-10-CM

## 2020-04-14 DIAGNOSIS — M79662 Pain in left lower leg: Secondary | ICD-10-CM

## 2020-04-14 DIAGNOSIS — R2689 Other abnormalities of gait and mobility: Secondary | ICD-10-CM | POA: Diagnosis not present

## 2020-04-14 NOTE — Therapy (Signed)
[facility] [address] [place], [place], [address] Phone: [phone]   Fax:  [phone]  Physical Therapy Treatment  Patient Details  Name: Kelly Vega MRN: [id number] Date of Birth: 1970-09-01 Referring Provider (PT): Clementeen Graham MD   Encounter Date: 04/14/2020   PT End of Session - 04/14/20 1651    Visit Number 3    Number of Visits 8    Date for PT Re-Evaluation 05/04/20    Authorization Type BCBS/ Federal employee PPO (visit limit 50 PT/OT/SLP, no auth)    Authorization - Visit Number 3    Authorization - Number of Visits 50    Progress Note Due on Visit 10    PT Start Time 1618    PT Stop Time 1700    PT Time Calculation (min) 42 min    Activity Tolerance Patient tolerated treatment well    Behavior During Therapy [facility] for tasks assessed/performed           No past medical history on file.  No past surgical history on file.  There were no vitals filed for this visit.   Subjective Assessment - 04/14/20 1620    Subjective pt states she is a little sore from being in therapy yesterday but having no pain currently.  Calf tightness    Currently in Pain? No/denies                             [facility] Adult PT Treatment/Exercise - 04/14/20 0001      Knee/Hip Exercises: Stretches   Active Hamstring Stretch Both;2 reps;30 seconds    Active Hamstring Stretch Limitations standing 12" and long sitting positions    Piriformis Stretch Both;2 reps;30 seconds    Piriformis Stretch Limitations seated    Gastroc Stretch Both;3 reps;30 seconds    Gastroc Stretch Limitations slant board and at counter      Knee/Hip Exercises: Standing   Heel Raises 2 sets;15 reps;Both    Heel Raises Limitations toe raises 2x15    Hip Abduction Both;2 sets;10 reps    Hip Extension Both;2 sets;10 reps      Knee/Hip Exercises: Seated   Sit to Sand 3 sets;10 reps;without UE support      Knee/Hip Exercises: Sidelying   Hip  ABduction Both;2 sets;10 reps      Knee/Hip Exercises: Prone   Hip Extension 2 sets;Both;10 reps                    PT Short Term Goals - 04/06/20 1611      PT SHORT TERM GOAL #1   Title Patient will be independent with HEP in order to improve functional outcomes.    Time 2    Period Weeks    Status New    Target Date 04/20/20      PT SHORT TERM GOAL #2   Title Patient will report at least 25% improvement in symptoms for improved quality of life.    Time 2    Period Weeks    Status New    Target Date 04/20/20             PT Long Term Goals - 04/06/20 1612      PT LONG TERM GOAL #1   Title Patient will report at least 75% improvement in symptoms for improved quality of life.    Time 4    Period Weeks    Status New  Target Date 05/04/20      PT LONG TERM GOAL #2   Title Patient will be able to walk unrestricted through [employer] without increase in symptoms for improved community ambualtion.    Time 4    Period Weeks    Status New    Target Date 05/04/20                 Plan - 04/14/20 1657    Clinical Impression Statement Began session with stretches due to increased tightness.  Added hamstring stretch and instructed in both standing and long sitting positions.   Hamstring tightness present bilaterally with Lt LE being more tight than Rt LE.  Seated piriformis stretch also added with noted tightness.  Progressed to standing hip abduction and extension with cues to complete therex slowly and controlled.  Pt reported good workout with session today.    Personal Factors and Comorbidities Comorbidity 2;Fitness;Time since onset of injury/illness/exacerbation    Comorbidities Obesity, HTN    Examination-Activity Limitations Locomotion Level;Transfers;Stand;Stairs    Examination-Participation Restrictions Community Activity;Driving;Shop;Volunteer    Stability/Clinical Decision Making Evolving/Moderate complexity    Rehab Potential Fair    PT Frequency 2x  / week    PT Duration 4 weeks    PT Treatment/Interventions ADLs/Self Care Home Management;Aquatic Therapy;Cryotherapy;Electrical Stimulation;Moist Heat;Traction;Ultrasound;DME Instruction;Gait training;Stair training;Functional mobility training;Therapeutic activities;Therapeutic exercise;Balance training;Neuromuscular re-education;Patient/family education;Orthotic Fit/Training;Manual techniques;Manual lymph drainage;Compression bandaging;Scar mobilization;Passive range of motion;Dry needling;Energy conservation;Splinting;Taping;Spinal Manipulations;Joint Manipulations    PT Next Visit Plan continue core and hip strengthening, continue bilateral LE strengtheing to build endurance,  Possibily assess repeated motions, possibly assess walking to elicit symptoms.    PT Home Exercise Plan 9/22 calf stretch, heel and toe raises, sit to stand    Consulted and Agree with Plan of Care Patient           Patient will benefit from skilled therapeutic intervention in order to improve the following deficits and impairments:  Abnormal gait, Decreased activity tolerance, Decreased range of motion, Difficulty walking, Pain  Visit Diagnosis: Pain in left lower leg  Low back pain, unspecified back pain laterality, unspecified chronicity, unspecified whether sciatica present  Muscle weakness (generalized)  Other symptoms and signs involving the musculoskeletal system  Other abnormalities of gait and mobility     Problem List Patient Active Problem List   Diagnosis Date Noted  . Vitamin D insufficiency 03/22/2020  . Need for hepatitis C screening test 03/21/2020  . Routine general medical examination at a health care facility 03/21/2020  . Cervical cancer screening 03/21/2020  . Morbid obesity (HCC) 03/21/2020  . Essential hypertension 03/21/2020  . Chronic pain of left knee 03/21/2020  . Post-traumatic osteoarthritis of left knee 03/21/2020   Lurena Nida, PTA/CLT [phone]  Lurena Nida 04/14/2020, 4:57 PM  [facility] [address] [place], [place], [address] Phone: [phone]   Fax:  [phone]  Name: Tawnee Clegg MRN: [id number] Date of Birth: [birth year]

## 2020-04-20 ENCOUNTER — Ambulatory Visit: Payer: Federal, State, Local not specified - PPO | Admitting: Nurse Practitioner

## 2020-04-20 ENCOUNTER — Encounter (HOSPITAL_COMMUNITY): Payer: Federal, State, Local not specified - PPO | Admitting: Physical Therapy

## 2020-04-21 ENCOUNTER — Other Ambulatory Visit: Payer: Self-pay

## 2020-04-21 ENCOUNTER — Encounter (HOSPITAL_COMMUNITY): Payer: Self-pay | Admitting: Physical Therapy

## 2020-04-21 ENCOUNTER — Ambulatory Visit (HOSPITAL_COMMUNITY): Payer: Federal, State, Local not specified - PPO | Admitting: Physical Therapy

## 2020-04-21 DIAGNOSIS — R2689 Other abnormalities of gait and mobility: Secondary | ICD-10-CM | POA: Diagnosis not present

## 2020-04-21 DIAGNOSIS — R29898 Other symptoms and signs involving the musculoskeletal system: Secondary | ICD-10-CM

## 2020-04-21 DIAGNOSIS — M6281 Muscle weakness (generalized): Secondary | ICD-10-CM | POA: Diagnosis not present

## 2020-04-21 DIAGNOSIS — M79662 Pain in left lower leg: Secondary | ICD-10-CM

## 2020-04-21 DIAGNOSIS — M545 Low back pain, unspecified: Secondary | ICD-10-CM

## 2020-04-21 NOTE — Therapy (Signed)
[facility] [address] [place], [place], [address] Phone: [phone]   Fax:  [phone]  Physical Therapy Treatment  Patient Details  Name: Trysta Showman MRN: [id number] Date of Birth: [birth year] Referring Provider (PT): Clementeen Graham MD   Encounter Date: 04/21/2020   PT End of Session - 04/21/20 1447    Visit Number 4    Number of Visits 8    Date for PT Re-Evaluation 05/04/20    Authorization Type BCBS/ Federal employee PPO (visit limit 50 PT/OT/SLP, no auth)    Authorization - Visit Number 4    Authorization - Number of Visits 50    Progress Note Due on Visit 10    PT Start Time 1447    PT Stop Time 1526    PT Time Calculation (min) 39 min    Activity Tolerance Patient tolerated treatment well    Behavior During Therapy [facility] for tasks assessed/performed           History reviewed. No pertinent past medical history.  History reviewed. No pertinent surgical history.  There were no vitals filed for this visit.   Subjective Assessment - 04/21/20 1451    Subjective States she is feeling much better. States that she is no longer dragging her leg. States that she has yet to go to [place] but plans on going this weekend and will see how does there. No current symptoms. Reports she doesnt do her exercises like she should. States that when she thinks about it she does it.    Currently in Pain? No/denies              [facility] PT Assessment - 04/21/20 0001      Assessment   Medical Diagnosis L knee pain, Lumbar Radiculopathy    Referring Provider (PT) Clementeen Graham MD    Onset Date/Surgical Date 06/23/19                         [facility] Adult PT Treatment/Exercise - 04/21/20 0001      Knee/Hip Exercises: Standing   Functional Squat 4 sets;5 reps      Knee/Hip Exercises: Supine   Bridges AROM;Strengthening;5 reps;4 sets    Bridges Limitations 5" holds, focus on breathing throughout    Other Supine Knee/Hip Exercises  DF/PF x10 for stretch       Knee/Hip Exercises: Prone   Hamstring Curl 3 sets;10 reps;5 seconds   B   Other Prone Exercises hip extension with abduction 4x5 B                   PT Education - 04/21/20 1519    Education Details Educated patient in body scan prior to sleeping to help reduce tension in body and muscles at the end of the day. on HEP and rationale for exercises    Person(s) Educated Patient    Methods Explanation    Comprehension Verbalized understanding            PT Short Term Goals - 04/06/20 1611      PT SHORT TERM GOAL #1   Title Patient will be independent with HEP in order to improve functional outcomes.    Time 2    Period Weeks    Status New    Target Date 04/20/20      PT SHORT TERM GOAL #2   Title Patient will report at least 25% improvement in symptoms for improved quality of life.  Time 2    Period Weeks    Status New    Target Date 04/20/20             PT Long Term Goals - 04/06/20 1612      PT LONG TERM GOAL #1   Title Patient will report at least 75% improvement in symptoms for improved quality of life.    Time 4    Period Weeks    Status New    Target Date 05/04/20      PT LONG TERM GOAL #2   Title Patient will be able to walk unrestricted through [employer] without increase in symptoms for improved community ambualtion.    Time 4    Period Weeks    Status New    Target Date 05/04/20                 Plan - 04/21/20 1451    Clinical Impression Statement Focused on continued strengthening and mobility work of hips. Patient tolerated this well and reported all exercises felt relaxing. No pain noted during session. Fatigue in hips noted end of session. Discussed and educated patient in body scan prior to going to bed to help reduce tension in muscles prior to sleeping. Will continue to focus on hip strengthening as tolerated.    Personal Factors and Comorbidities Comorbidity 2;Fitness;Time since onset of  injury/illness/exacerbation    Comorbidities Obesity, HTN    Examination-Activity Limitations Locomotion Level;Transfers;Stand;Stairs    Examination-Participation Restrictions Community Activity;Driving;Shop;Volunteer    Stability/Clinical Decision Making Evolving/Moderate complexity    Rehab Potential Fair    PT Frequency 2x / week    PT Duration 4 weeks    PT Treatment/Interventions ADLs/Self Care Home Management;Aquatic Therapy;Cryotherapy;Electrical Stimulation;Moist Heat;Traction;Ultrasound;DME Instruction;Gait training;Stair training;Functional mobility training;Therapeutic activities;Therapeutic exercise;Balance training;Neuromuscular re-education;Patient/family education;Orthotic Fit/Training;Manual techniques;Manual lymph drainage;Compression bandaging;Scar mobilization;Passive range of motion;Dry needling;Energy conservation;Splinting;Taping;Spinal Manipulations;Joint Manipulations    PT Next Visit Plan continue core and hip strengthening, continue bilateral LE strengtheing to build endurance,  Possibily assess repeated motions, possibly assess walking to elicit symptoms.    PT Home Exercise Plan 9/22 calf stretch, heel and toe raises, sit to stand; 9/30 bridge, hamstring curl    Consulted and Agree with Plan of Care Patient           Patient will benefit from skilled therapeutic intervention in order to improve the following deficits and impairments:  Abnormal gait, Decreased activity tolerance, Decreased range of motion, Difficulty walking, Pain  Visit Diagnosis: Pain in left lower leg  Low back pain, unspecified back pain laterality, unspecified chronicity, unspecified whether sciatica present  Muscle weakness (generalized)  Other symptoms and signs involving the musculoskeletal system     Problem List Patient Active Problem List   Diagnosis Date Noted   Vitamin D insufficiency 03/22/2020   Need for hepatitis C screening test 03/21/2020   Routine general medical  examination at a health care facility 03/21/2020   Cervical cancer screening 03/21/2020   Morbid obesity (HCC) 03/21/2020   Essential hypertension 03/21/2020   Chronic pain of left knee 03/21/2020   Post-traumatic osteoarthritis of left knee 03/21/2020   3:26 PM, 04/21/20 Tereasa Coop, DPT Physical Therapy with [facility]  [phone] office  [facility] [address] [place], [place], [address] Phone: [phone]   Fax:  [phone]  Name: Janara Klett MRN: [id number] Date of Birth: July 30, 1970

## 2020-04-22 ENCOUNTER — Encounter (HOSPITAL_COMMUNITY): Payer: Self-pay

## 2020-04-22 ENCOUNTER — Ambulatory Visit (HOSPITAL_COMMUNITY): Payer: Federal, State, Local not specified - PPO | Attending: Family Medicine

## 2020-04-22 DIAGNOSIS — M79662 Pain in left lower leg: Secondary | ICD-10-CM | POA: Diagnosis not present

## 2020-04-22 DIAGNOSIS — R2689 Other abnormalities of gait and mobility: Secondary | ICD-10-CM | POA: Insufficient documentation

## 2020-04-22 DIAGNOSIS — M6281 Muscle weakness (generalized): Secondary | ICD-10-CM | POA: Insufficient documentation

## 2020-04-22 DIAGNOSIS — M545 Low back pain, unspecified: Secondary | ICD-10-CM | POA: Insufficient documentation

## 2020-04-22 DIAGNOSIS — R29898 Other symptoms and signs involving the musculoskeletal system: Secondary | ICD-10-CM | POA: Diagnosis not present

## 2020-04-22 NOTE — Therapy (Signed)
[facility] [address] [place], [place], [address] Phone: [phone]   Fax:  [phone]  Physical Therapy Treatment  Patient Details  Name: Kelly Vega MRN: [id number] Date of Birth: 1971/03/19 Referring Provider (PT): Clementeen Graham MD   Encounter Date: 04/22/2020   PT End of Session - 04/22/20 0925    Visit Number 5    Number of Visits 8    Date for PT Re-Evaluation 05/04/20    Authorization Type BCBS/ Federal employee PPO (visit limit 50 PT/OT/SLP, no auth)    Authorization - Visit Number 5    Authorization - Number of Visits 50    Progress Note Due on Visit 10    PT Start Time [id number]    PT Stop Time 1005    PT Time Calculation (min) 47 min    Activity Tolerance Patient tolerated treatment well;No increased pain    Behavior During Therapy [facility] for tasks assessed/performed           History reviewed. No pertinent past medical history.  History reviewed. No pertinent surgical history.  There were no vitals filed for this visit.   Subjective Assessment - 04/22/20 0920    Subjective Pt stated she has been feeling good but hasn't been walking at [facility] since she began therapy.  No reports of pain.  Admits she hasn't been compliant with exercises at home.    Patient Stated Goals avoid medications and fix her symptoms    Currently in Pain? No/denies                             [facility] Adult PT Treatment/Exercise - 04/22/20 0001      Posture/Postural Control   Posture/Postural Control Postural limitations    Postural Limitations Increased lumbar lordosis      Exercises   Exercises Knee/Hip      Knee/Hip Exercises: Stretches   Active Hamstring Stretch Both;2 reps;30 seconds    Active Hamstring Stretch Limitations supine with hands behind knee    Gastroc Stretch Both;3 reps;30 seconds    Gastroc Stretch Limitations slant board      Knee/Hip Exercises: Standing   Heel Raises 2 sets;15 reps;Both    Heel  Raises Limitations 5" holds, no HHA    Hip Abduction 10 reps    Functional Squat 2 sets;10 reps      Knee/Hip Exercises: Supine   Bridges AROM;Strengthening;2 sets;10 reps    Bridges Limitations 5" holds, focus on breathing throughout      Knee/Hip Exercises: Sidelying   Hip ABduction Both;2 sets;10 reps    Hip ABduction Limitations cueing to reduce hip flexion       Knee/Hip Exercises: Prone   Hip Extension 2 sets;Both;10 reps    Hip Extension Limitations cueing for form                  PT Education - 04/22/20 0925    Education Details Educated on benefits of compliance wiht HEP for maximal benefits.  Also discussed beginning walking program at home, stating with short duration and increasing as able.    Person(s) Educated Patient    Methods Explanation    Comprehension Verbalized understanding            PT Short Term Goals - 04/06/20 1611      PT SHORT TERM GOAL #1   Title Patient will be independent with HEP in order to improve functional outcomes.  Time 2    Period Weeks    Status New    Target Date 04/20/20      PT SHORT TERM GOAL #2   Title Patient will report at least 25% improvement in symptoms for improved quality of life.    Time 2    Period Weeks    Status New    Target Date 04/20/20             PT Long Term Goals - 04/06/20 1612      PT LONG TERM GOAL #1   Title Patient will report at least 75% improvement in symptoms for improved quality of life.    Time 4    Period Weeks    Status New    Target Date 05/04/20      PT LONG TERM GOAL #2   Title Patient will be able to walk unrestricted through [employer] without increase in symptoms for improved community ambualtion.    Time 4    Period Weeks    Status New    Target Date 05/04/20                 Plan - 04/22/20 9147    Clinical Impression Statement Reviewed importance of HEP compliance for maximal benefits, encouraged increased compliance as well as beginning walking  program to address symptoms.  Encouraged shorter duration of walking and increasing as able.  Session focus with strengthening and hip strengthening.  Attempted to increase reps though required increased cueing for proper form/mechanics due to increased musculature fatigue and compensation with lumbar muscualture during prone exercises, so left at 10 reps.  No reports of pain through session.    Personal Factors and Comorbidities Comorbidity 2;Fitness;Time since onset of injury/illness/exacerbation    Comorbidities Obesity, HTN    Examination-Activity Limitations Locomotion Level;Transfers;Stand;Stairs    Examination-Participation Restrictions Community Activity;Driving;Shop;Volunteer    Stability/Clinical Decision Making Evolving/Moderate complexity    Clinical Decision Making Moderate    Rehab Potential Fair    PT Frequency 2x / week    PT Duration 4 weeks    PT Treatment/Interventions ADLs/Self Care Home Management;Aquatic Therapy;Cryotherapy;Electrical Stimulation;Moist Heat;Traction;Ultrasound;DME Instruction;Gait training;Stair training;Functional mobility training;Therapeutic activities;Therapeutic exercise;Balance training;Neuromuscular re-education;Patient/family education;Orthotic Fit/Training;Manual techniques;Manual lymph drainage;Compression bandaging;Scar mobilization;Passive range of motion;Dry needling;Energy conservation;Splinting;Taping;Spinal Manipulations;Joint Manipulations    PT Next Visit Plan FOTO next session as therapist forgot on visit #5.  continue core and hip strengthening, continue bilateral LE strengtheing to build endurance,  Possibily assess repeated motions, possibly assess walking to elicit symptoms.    PT Home Exercise Plan 9/22 calf stretch, heel and toe raises, sit to stand; 9/30 bridge, hamstring curl; 10/01: short duration of walking program           Patient will benefit from skilled therapeutic intervention in order to improve the following deficits and  impairments:  Abnormal gait, Decreased activity tolerance, Decreased range of motion, Difficulty walking, Pain  Visit Diagnosis: Pain in left lower leg  Low back pain, unspecified back pain laterality, unspecified chronicity, unspecified whether sciatica present  Muscle weakness (generalized)  Other symptoms and signs involving the musculoskeletal system  Other abnormalities of gait and mobility     Problem List Patient Active Problem List   Diagnosis Date Noted  . Vitamin D insufficiency 03/22/2020  . Need for hepatitis C screening test 03/21/2020  . Routine general medical examination at a health care facility 03/21/2020  . Cervical cancer screening 03/21/2020  . Morbid obesity (HCC) 03/21/2020  . Essential hypertension 03/21/2020  .  Chronic pain of left knee 03/21/2020  . Post-traumatic osteoarthritis of left knee 03/21/2020   Becky Sax, LPTA/CLT; CBIS [phone]   Juel Burrow 04/22/2020, 10:14 AM  [facility] [address] [place], [place], [address] Phone: [phone]   Fax:  [phone]  Name: Shy Guallpa MRN: [id number] Date of Birth: 26-Dec-1970

## 2020-04-26 ENCOUNTER — Other Ambulatory Visit: Payer: Self-pay

## 2020-04-26 ENCOUNTER — Encounter (HOSPITAL_COMMUNITY): Payer: Self-pay | Admitting: Physical Therapy

## 2020-04-26 ENCOUNTER — Ambulatory Visit (HOSPITAL_COMMUNITY): Payer: Federal, State, Local not specified - PPO | Admitting: Physical Therapy

## 2020-04-26 DIAGNOSIS — M6281 Muscle weakness (generalized): Secondary | ICD-10-CM

## 2020-04-26 DIAGNOSIS — M79662 Pain in left lower leg: Secondary | ICD-10-CM

## 2020-04-26 DIAGNOSIS — R29898 Other symptoms and signs involving the musculoskeletal system: Secondary | ICD-10-CM

## 2020-04-26 DIAGNOSIS — M545 Low back pain, unspecified: Secondary | ICD-10-CM | POA: Diagnosis not present

## 2020-04-26 DIAGNOSIS — R2689 Other abnormalities of gait and mobility: Secondary | ICD-10-CM

## 2020-04-26 NOTE — Therapy (Signed)
[facility] [address] [place], [place], [address] Phone: [phone]   Fax:  [phone]  Physical Therapy Treatment  Patient Details  Name: Kelly Vega MRN: [id number] Date of Birth: [birth year] Referring Provider (PT): Clementeen Graham MD   Encounter Date: 04/26/2020   PT End of Session - 04/26/20 1046    Visit Number 6    Number of Visits 8    Date for PT Re-Evaluation 05/04/20    Authorization Type BCBS/ Federal employee PPO (visit limit 50 PT/OT/SLP, no auth)    Authorization - Visit Number 6    Authorization - Number of Visits 50    Progress Note Due on Visit 10    PT Start Time 1047    PT Stop Time 1126    PT Time Calculation (min) 39 min    Activity Tolerance Patient tolerated treatment well;No increased pain    Behavior During Therapy [facility] for tasks assessed/performed           History reviewed. No pertinent past medical history.  History reviewed. No pertinent surgical history.  There were no vitals filed for this visit.   Subjective Assessment - 04/26/20 1047    Subjective Patient states she has been feeling great. She went to [facility] and she did not have increase in symptoms and she was there for about 30-60 minutes. She does not always do her exercises.    Patient Stated Goals avoid medications and fix her symptoms    Currently in Pain? No/denies                             [facility] Adult PT Treatment/Exercise - 04/26/20 0001      Knee/Hip Exercises: Stretches   Gastroc Stretch Both;3 reps;30 seconds    Gastroc Stretch Limitations slant board      Knee/Hip Exercises: Standing   Heel Raises 2 sets;15 reps;Both    Heel Raises Limitations 5" holds, intermittent HHA; on incline    Hip Flexion 1 set;20 reps;Both    Lateral Step Up Both;1 set;10 reps;Hand Hold: 1;Step Height: 6"    Lateral Step Up Limitations eccentric control    Forward Step Up Both;1 set;15 reps;Hand Hold: 0;Step Height: 6"     Functional Squat 10 reps;3 sets    Functional Squat Limitations sumo squat, 1set without weight, 2nd and 3rd with box with 5#    SLS with Vectors 5x 5 second holds bilateral     Other Standing Knee Exercises lateral stepping with mini squat 6x 15 feet bilateral                   PT Education - 04/26/20 1046    Education Details Patient educated on HEP, exercise mechanics    Person(s) Educated Patient    Methods Explanation;Demonstration    Comprehension Verbalized understanding;Returned demonstration            PT Short Term Goals - 04/06/20 1611      PT SHORT TERM GOAL #1   Title Patient will be independent with HEP in order to improve functional outcomes.    Time 2    Period Weeks    Status New    Target Date 04/20/20      PT SHORT TERM GOAL #2   Title Patient will report at least 25% improvement in symptoms for improved quality of life.    Time 2    Period Weeks  Status New    Target Date 04/20/20             PT Long Term Goals - 04/06/20 1612      PT LONG TERM GOAL #1   Title Patient will report at least 75% improvement in symptoms for improved quality of life.    Time 4    Period Weeks    Status New    Target Date 05/04/20      PT LONG TERM GOAL #2   Title Patient will be able to walk unrestricted through [employer] without increase in symptoms for improved community ambualtion.    Time 4    Period Weeks    Status New    Target Date 05/04/20                 Plan - 04/26/20 1046    Clinical Impression Statement Patient progressing well with PT intervention and has not had increase in symptoms recently. Will continue to progress per POC. Patient requires intermittent HHA with heel raises on incline for balance. Patient given cueing for glute activation with SLS with vectors and demonstrates min/mod sway secondary to impaired balance and fatigue. She is able to complete vectors without UE support but does require frequent short rest breaks  during. Patient shown how to complete calf stretch at home at wall/counter and on completing exercises throughout day. Added stair exercises for improving quad strength, motor control, and endurance. Patient given cueing for controlled eccentric phase. Patient will continue to benefit from skilled physical therapy in order to reduce impairment and improve function.    Personal Factors and Comorbidities Comorbidity 2;Fitness;Time since onset of injury/illness/exacerbation    Comorbidities Obesity, HTN    Examination-Activity Limitations Locomotion Level;Transfers;Stand;Stairs    Examination-Participation Restrictions Community Activity;Driving;Shop;Volunteer    Stability/Clinical Decision Making Evolving/Moderate complexity    Rehab Potential Fair    PT Frequency 2x / week    PT Duration 4 weeks    PT Treatment/Interventions ADLs/Self Care Home Management;Aquatic Therapy;Cryotherapy;Electrical Stimulation;Moist Heat;Traction;Ultrasound;DME Instruction;Gait training;Stair training;Functional mobility training;Therapeutic activities;Therapeutic exercise;Balance training;Neuromuscular re-education;Patient/family education;Orthotic Fit/Training;Manual techniques;Manual lymph drainage;Compression bandaging;Scar mobilization;Passive range of motion;Dry needling;Energy conservation;Splinting;Taping;Spinal Manipulations;Joint Manipulations    PT Next Visit Plan continue core and hip strengthening, continue bilateral LE strengtheing to build endurance,  Possibily assess repeated motions, possibly assess walking to elicit symptoms.    PT Home Exercise Plan 9/22 calf stretch, heel and toe raises, sit to stand; 9/30 bridge, hamstring curl; 10/01: short duration of walking program           Patient will benefit from skilled therapeutic intervention in order to improve the following deficits and impairments:  Abnormal gait, Decreased activity tolerance, Decreased range of motion, Difficulty walking, Pain  Visit  Diagnosis: Pain in left lower leg  Low back pain, unspecified back pain laterality, unspecified chronicity, unspecified whether sciatica present  Muscle weakness (generalized)  Other symptoms and signs involving the musculoskeletal system  Other abnormalities of gait and mobility     Problem List Patient Active Problem List   Diagnosis Date Noted  . Vitamin D insufficiency 03/22/2020  . Need for hepatitis C screening test 03/21/2020  . Routine general medical examination at a health care facility 03/21/2020  . Cervical cancer screening 03/21/2020  . Morbid obesity (HCC) 03/21/2020  . Essential hypertension 03/21/2020  . Chronic pain of left knee 03/21/2020  . Post-traumatic osteoarthritis of left knee 03/21/2020   11:26 AM, 04/26/20 Wyman Songster PT, DPT Physical Therapist at [facility]  [facility]   [facility] [address] [place], [place], [address] Phone: [phone]   Fax:  [phone]  Name: Kelly Vega MRN: [id number] Date of Birth: 1970/09/26

## 2020-04-28 ENCOUNTER — Other Ambulatory Visit: Payer: Self-pay

## 2020-04-28 ENCOUNTER — Ambulatory Visit (HOSPITAL_COMMUNITY): Payer: Federal, State, Local not specified - PPO | Admitting: Physical Therapy

## 2020-04-28 ENCOUNTER — Encounter (HOSPITAL_COMMUNITY): Payer: Self-pay | Admitting: Physical Therapy

## 2020-04-28 DIAGNOSIS — R2689 Other abnormalities of gait and mobility: Secondary | ICD-10-CM

## 2020-04-28 DIAGNOSIS — R29898 Other symptoms and signs involving the musculoskeletal system: Secondary | ICD-10-CM

## 2020-04-28 DIAGNOSIS — M545 Low back pain, unspecified: Secondary | ICD-10-CM | POA: Diagnosis not present

## 2020-04-28 DIAGNOSIS — M6281 Muscle weakness (generalized): Secondary | ICD-10-CM

## 2020-04-28 DIAGNOSIS — M79662 Pain in left lower leg: Secondary | ICD-10-CM | POA: Diagnosis not present

## 2020-04-28 NOTE — Therapy (Signed)
[facility] [address] [place], [place], [address] Phone: [phone]   Fax:  [phone]  Physical Therapy Treatment/Discharge Summary  Patient Details  Name: Kelly Vega MRN: [id number] Date of Birth: 1970-11-08 Referring Provider (PT): Lynne Leader MD   Encounter Date: 04/28/2020  PHYSICAL THERAPY DISCHARGE SUMMARY  Visits from Start of Care: 7  Current functional level related to goals / functional outcomes: See below   Remaining deficits: See below   Education / Equipment: See below  Plan: Patient agrees to discharge.  Patient goals were met. Patient is being discharged due to meeting the stated rehab goals.  ?????        PT End of Session - 04/28/20 0830    Visit Number 7    Number of Visits 8    Date for PT Re-Evaluation 05/04/20    Authorization Type BCBS/ Federal employee PPO (visit limit 50 PT/OT/SLP, no auth)    Authorization - Visit Number 7    Authorization - Number of Visits 50    Progress Note Due on Visit 10    PT Start Time 0830    PT Stop Time 0904    PT Time Calculation (min) 34 min    Activity Tolerance Patient tolerated treatment well;No increased pain    Behavior During Therapy [facility] for tasks assessed/performed           History reviewed. No pertinent past medical history.  History reviewed. No pertinent surgical history.  There were no vitals filed for this visit.   Subjective Assessment - 04/28/20 0831    Subjective Patient states she went to [facility] after last session and did not have any symptoms. She is ready to make today her last day. She states at least 80% improvement with physical therapy intervention. She does not currently feel limited with ADL.    How long can you walk comfortably? unrestricted    Patient Stated Goals avoid medications and fix her symptoms    Currently in Pain? No/denies              [facility] PT Assessment - 04/28/20 0001      Assessment   Medical  Diagnosis L knee pain, Lumbar Radiculopathy    Referring Provider (PT) Lynne Leader MD    Onset Date/Surgical Date 06/23/19    Next MD Visit Oct 20    Prior Therapy none      Precautions   Precautions None      Restrictions   Weight Bearing Restrictions No      Balance Screen   Has the patient fallen in the past 6 months No    Has the patient had a decrease in activity level because of a fear of falling?  No    Is the patient reluctant to leave their home because of a fear of falling?  No      Prior Function   Level of Independence Independent    Vocation Full time employment    Vocation Requirements [employer]      Cognition   Overall Cognitive Status Within Functional Limits for tasks assessed      Observation/Other Assessments   Observations Ambulates without AD, L ankle edema laterally    Focus on Therapeutic Outcomes (FOTO)  not completed today      Posture/Postural Control   Posture/Postural Control Postural limitations    Postural Limitations Increased lumbar lordosis      AROM   Lumbar Flexion 0% limited    Lumbar  Extension 0% limited    Lumbar - Right Side Bend 0% limited    Lumbar - Left Side Bend 0% limited    Lumbar - Right Rotation 0% limited    Lumbar - Left Rotation 0% limited      Strength   Right Hip Flexion 5/5    Left Hip Flexion 5/5    Right Knee Flexion 5/5    Right Knee Extension 5/5    Left Knee Flexion 5/5    Left Knee Extension 5/5    Right Ankle Dorsiflexion 5/5    Left Ankle Dorsiflexion 5/5                         [facility] Adult PT Treatment/Exercise - 04/28/20 0001      Knee/Hip Exercises: Stretches   Gastroc Stretch Both;3 reps;30 seconds    Gastroc Stretch Limitations slant board      Knee/Hip Exercises: Standing   Hip Flexion 1 set;20 reps;Both    Hip Abduction Both;10 reps    SLS with Vectors 5x 5 second holds bilateral     Other Standing Knee Exercises lateral stepping with mini squat 6x 15 feet bilateral        Knee/Hip Exercises: Seated   Other Seated Knee/Hip Exercises swiss ball marching 3x10 bilateral                  PT Education - 04/28/20 0831    Education Details Patient educated on HEP, exercise mechanics, review of HEP, walking program, returning to PT if needed    Person(s) Educated Patient    Methods Explanation;Demonstration;Handout    Comprehension Verbalized understanding;Returned demonstration            PT Short Term Goals - 04/28/20 0835      PT SHORT TERM GOAL #1   Title Patient will be independent with HEP in order to improve functional outcomes.    Time 2    Period Weeks    Status Achieved    Target Date 04/20/20      PT SHORT TERM GOAL #2   Title Patient will report at least 25% improvement in symptoms for improved quality of life.    Time 2    Period Weeks    Status Achieved    Target Date 04/20/20             PT Long Term Goals - 04/28/20 0623      PT LONG TERM GOAL #1   Title Patient will report at least 75% improvement in symptoms for improved quality of life.    Time 4    Period Weeks    Status Achieved      PT LONG TERM GOAL #2   Title Patient will be able to walk unrestricted through [employer] without increase in symptoms for improved community ambualtion.    Time 4    Period Weeks    Status Achieved                 Plan - 04/28/20 0831    Clinical Impression Statement Patient has met all short and long term goals with ability to complete HEP, improvement in symptoms, and improved ambulation. Patient showing improving strength, ROM, gait, and activity tolerance since beginning therapy. Reviewed HEP with patient and added to HEP for comprehensive program. Patient also educated on increasing overall physical activity. Patient performs all exercises today with min/no cueing. Patient discharged at this time.    Personal  Factors and Comorbidities Comorbidity 2;Fitness;Time since onset of injury/illness/exacerbation    Comorbidities  Obesity, HTN    Examination-Activity Limitations Locomotion Level;Transfers;Stand;Stairs    Examination-Participation Restrictions Community Activity;Driving;Shop;Volunteer    Stability/Clinical Decision Making Evolving/Moderate complexity    Rehab Potential Fair    PT Frequency 2x / week    PT Duration 4 weeks    PT Treatment/Interventions ADLs/Self Care Home Management;Aquatic Therapy;Cryotherapy;Electrical Stimulation;Moist Heat;Traction;Ultrasound;DME Instruction;Gait training;Stair training;Functional mobility training;Therapeutic activities;Therapeutic exercise;Balance training;Neuromuscular re-education;Patient/family education;Orthotic Fit/Training;Manual techniques;Manual lymph drainage;Compression bandaging;Scar mobilization;Passive range of motion;Dry needling;Energy conservation;Splinting;Taping;Spinal Manipulations;Joint Manipulations    PT Next Visit Plan n/a    PT Home Exercise Plan 9/22 calf stretch, heel and toe raises, sit to stand; 9/30 bridge, hamstring curl; 10/01: short duration of walking program 10/7 Gastroc Stretch on Wall, Swiss Ball, Standing Marching, Standing Hip Abduction with Counter Support , Step Up Lateral Step Down Sumo Squat with Dumbbell Single Leg Balance with Clock Reach Prone Hip Extension           Patient will benefit from skilled therapeutic intervention in order to improve the following deficits and impairments:  Abnormal gait, Decreased activity tolerance, Decreased range of motion, Difficulty walking, Pain  Visit Diagnosis: Pain in left lower leg  Low back pain, unspecified back pain laterality, unspecified chronicity, unspecified whether sciatica present  Muscle weakness (generalized)  Other symptoms and signs involving the musculoskeletal system  Other abnormalities of gait and mobility     Problem List Patient Active Problem List   Diagnosis Date Noted  . Vitamin D insufficiency 03/22/2020  . Need for hepatitis C screening test  03/21/2020  . Routine general medical examination at a health care facility 03/21/2020  . Cervical cancer screening 03/21/2020  . Morbid obesity ([place]) 03/21/2020  . Essential hypertension 03/21/2020  . Chronic pain of left knee 03/21/2020  . Post-traumatic osteoarthritis of left knee 03/21/2020   9:12 AM, 04/28/20 Mearl Latin PT, DPT Physical Therapist at [facility] [place], [place], [address] Phone: [phone]   Fax:  [phone]  Name: Amarya Kuehl MRN: [id number] Date of Birth: [birth year]

## 2020-04-28 NOTE — Patient Instructions (Signed)
Access Code: [email or web address] URL: [email or web address] Date: 04/28/2020 Prepared by: [facility] Science writer on [employer] - 1 x daily - 7 x weekly - 3 reps - 30 second hold Swiss Ball March - 1 x daily - 7 x weekly - 3 sets - 10 reps Standing Marching - 1 x daily - 7 x weekly - 2 sets - 10 reps Standing Hip Abduction with Counter Support - 1 x daily - 7 x weekly - 3 sets - 10 reps Step Up - 1 x daily - 7 x weekly - 2 sets - 15 reps Lateral Step Down - 1 x daily - 7 x weekly - 2 sets - 10 reps Sumo Squat with Dumbbell - 1 x daily - 7 x weekly - 3 sets - 10 reps Single Leg Balance with Clock Reach - 1 x daily - 7 x weekly - 5 reps - 5 second hold Sidestepping - 1 x daily - 7 x weekly - 6 sets - 10 reps Prone Hip Extension - 1 x daily - 7 x weekly - 2 sets - 10 reps

## 2020-05-02 ENCOUNTER — Ambulatory Visit (INDEPENDENT_AMBULATORY_CARE_PROVIDER_SITE_OTHER): Payer: Federal, State, Local not specified - PPO | Admitting: Nurse Practitioner

## 2020-05-02 ENCOUNTER — Ambulatory Visit (INDEPENDENT_AMBULATORY_CARE_PROVIDER_SITE_OTHER): Payer: Federal, State, Local not specified - PPO | Admitting: Internal Medicine

## 2020-05-02 ENCOUNTER — Encounter: Payer: Self-pay | Admitting: Internal Medicine

## 2020-05-02 ENCOUNTER — Encounter: Payer: Self-pay | Admitting: Nurse Practitioner

## 2020-05-02 ENCOUNTER — Other Ambulatory Visit: Payer: Self-pay

## 2020-05-02 VITALS — BP 162/96 | HR 88 | Temp 98.5°F | Resp 16 | Ht 67.0 in | Wt 280.0 lb

## 2020-05-02 VITALS — BP 144/88 | Ht 67.0 in | Wt 279.0 lb

## 2020-05-02 DIAGNOSIS — R030 Elevated blood-pressure reading, without diagnosis of hypertension: Secondary | ICD-10-CM | POA: Diagnosis not present

## 2020-05-02 DIAGNOSIS — Z113 Encounter for screening for infections with a predominantly sexual mode of transmission: Secondary | ICD-10-CM

## 2020-05-02 DIAGNOSIS — Z1151 Encounter for screening for human papillomavirus (HPV): Secondary | ICD-10-CM | POA: Diagnosis not present

## 2020-05-02 DIAGNOSIS — Z01419 Encounter for gynecological examination (general) (routine) without abnormal findings: Secondary | ICD-10-CM | POA: Diagnosis not present

## 2020-05-02 DIAGNOSIS — R0683 Snoring: Secondary | ICD-10-CM | POA: Diagnosis not present

## 2020-05-02 DIAGNOSIS — I10 Essential (primary) hypertension: Secondary | ICD-10-CM | POA: Diagnosis not present

## 2020-05-02 DIAGNOSIS — Z1211 Encounter for screening for malignant neoplasm of colon: Secondary | ICD-10-CM

## 2020-05-02 DIAGNOSIS — Z124 Encounter for screening for malignant neoplasm of cervix: Secondary | ICD-10-CM | POA: Diagnosis not present

## 2020-05-02 DIAGNOSIS — N951 Menopausal and female climacteric states: Secondary | ICD-10-CM

## 2020-05-02 LAB — HM PAP SMEAR

## 2020-05-02 MED ORDER — INDAPAMIDE 2.5 MG PO TABS: 2.5000 mg | ORAL_TABLET | Freq: Every day | ORAL | 0 refills | Status: DC

## 2020-05-02 NOTE — Progress Notes (Signed)
   Kelly Vega 1971/04/08 [id number]   History:  49 y.o. [id number] presents as new patient to establish care without GYN complaints. Normal pap history. Recently moved back from [place] and has not had health care in over 10 years.  Cycles have become irregular ranging from every 2 to 3 months with symptoms of hot flashes and night sweats.  Mother with history of breast cancer at age 14. HTN and OA managed by PCP. Sexually active occasionally with same partner.   Gynecologic History Patient's last menstrual period was 04/15/2020. Period Duration (Days): 5 Period Pattern: (!) Irregular Menstrual Flow: Moderate Dysmenorrhea: None Contraception: condoms Last Pap: around 12 years ago. Results were: normal Last mammogram: around 12 years ago. Results were: normal  Past medical history, past surgical history, family history and social history were all reviewed and documented in the EPIC chart.  ROS:  A ROS was performed and pertinent positives and negatives are included.  Exam:  Vitals:   05/02/20 1155  BP: (!) 144/88  Weight: 279 lb (126.6 kg)  Height: 5\' 7"  (1.702 m)   Body mass index is 43.7 kg/m.  General appearance:  Normal Thyroid:  Symmetrical, normal in size, without palpable masses or nodularity. Respiratory  Auscultation:  Clear without wheezing or rhonchi Cardiovascular  Auscultation:  Regular rate, without rubs, murmurs or gallops  Edema/varicosities:  Not grossly evident Abdominal  Soft,nontender, without masses, guarding or rebound.  Liver/spleen:  No organomegaly noted  Hernia:  None appreciated  Skin  Inspection:  Grossly normal   Breasts: Examined lying and sitting.   Right: Without masses, retractions, discharge or axillary adenopathy.   Left: Without masses, retractions, discharge or axillary adenopathy. Gentitourinary   Inguinal/mons:  Normal without inguinal adenopathy  External genitalia:  Normal  BUS/Urethra/Skene's glands:  Normal  Vagina:   Normal  Cervix:  Normal  Uterus:  Difficult to palpate due to body habitus but no gross masses or tenderness  Adnexa/parametria:     Rt: Without masses or tenderness.   Lt: Without masses or tenderness.  Anus and perineum: Normal  Assessment/Plan:  49 y.o. 59 for annual exam.   Well female exam with routine gynecological exam - Education provided on SBEs, importance of preventative screenings, current guidelines, high calcium diet, regular exercise, and multivitamin daily. Labs with PCP.  Screen for STD (sexually transmitted disease) - Plan: C. trachomatis/N. gonorrhoeae RNA, HIV Antibody (routine testing w rflx), RPR  Encounter for Papanicolaou smear for cervical cancer screening -normal Pap history.  Discussed current guidelines.  Pap with high risk HPV typing today.  Perimenopause -we discussed menopause and what to expect.  She is aware that she is not considered postmenopausal until she is gone 1 year without a cycle.  Screening for breast cancer -had mammogram 12 to 13 years ago after her mother was diagnosed with breast cancer.  Has not had one since.  Discussed current guidelines and importance of preventative screenings.  Information provided on the breast center and she promises to call to schedule this soon.  Normal breast exam today.  Elevated blood pressure reading -PCP is managing this.  On indapamide 2.5 mg daily.  Follow-up in 1 year for annual.     [id number] [facility], 12:23 PM 05/02/2020

## 2020-05-02 NOTE — Addendum Note (Signed)
Addended by: Dayna Barker on: 05/02/2020 02:30 PM   Modules accepted: Orders

## 2020-05-02 NOTE — Progress Notes (Signed)
Subjective:  Patient ID: Kelly Vega, female    DOB: 1971/04/30  Age: 49 y.o. MRN: [id number]  CC: Hypertension  This visit occurred during the SARS-CoV-2 public health emergency.  Safety protocols were in place, including screening questions prior to the visit, additional usage of staff PPE, and extensive cleaning of exam room while observing appropriate contact time as indicated for disinfecting solutions.    HPI Kelly Vega presents for f/up - She tells me she is taking indapamide but has not been working on her lifestyle modifications.  She complains of weight gain and her mother complains about her snoring.  She denies headache, blurred vision, dizziness, lightheadedness, chest pain, or shortness of breath.  Outpatient Medications Prior to Visit  Medication Sig Dispense Refill  . Cholecalciferol 50 MCG (2000 UT) TABS Take 1 tablet (2,000 Units total) by mouth daily. 90 tablet 1  . TURMERIC PO Take 750 mg by mouth daily.    . indapamide (LOZOL) 1.25 MG tablet Take 1 tablet (1.25 mg total) by mouth daily. 90 tablet 0   No facility-administered medications prior to visit.    ROS Review of Systems  Constitutional: Positive for unexpected weight change (wt gain). Negative for chills, diaphoresis and fatigue.  HENT: Negative.  Negative for trouble swallowing.   Respiratory: Negative for apnea, shortness of breath and wheezing.        He mother says that she snores loudly  Cardiovascular: Negative for chest pain, palpitations and leg swelling.  Gastrointestinal: Negative for abdominal pain, constipation, diarrhea, nausea and vomiting.  Endocrine: Negative.   Genitourinary: Negative.  Negative for difficulty urinating.  Musculoskeletal: Negative for arthralgias and myalgias.  Skin: Negative.  Negative for color change.  Neurological: Negative.  Negative for dizziness, weakness, light-headedness and headaches.  Hematological: Negative for adenopathy. Does not bruise/bleed easily.    Psychiatric/Behavioral: Negative.     Objective:  BP (!) 162/96   Pulse 88   Temp 98.5 F (36.9 C) (Oral)   Resp 16   Ht 5\' 7"  (1.702 m)   Wt 280 lb (127 kg)   LMP 04/15/2020   SpO2 97%   BMI 43.85 kg/m   BP Readings from Last 3 Encounters:  05/02/20 (!) 144/88  05/02/20 (!) 162/96  03/30/20 (!) 140/100    Wt Readings from Last 3 Encounters:  05/02/20 279 lb (126.6 kg)  05/02/20 280 lb (127 kg)  03/30/20 270 lb (122.5 kg)    Physical Exam Vitals reviewed.  Constitutional:      General: She is not in acute distress.    Appearance: She is obese. She is not toxic-appearing.  HENT:     Nose: Nose normal.     Mouth/Throat:     Mouth: Mucous membranes are moist.  Eyes:     General: No scleral icterus.    Conjunctiva/sclera: Conjunctivae normal.  Cardiovascular:     Rate and Rhythm: Normal rate and regular rhythm.     Heart sounds: No murmur heard.   Pulmonary:     Effort: Pulmonary effort is normal.     Breath sounds: No stridor. No wheezing, rhonchi or rales.  Abdominal:     General: Abdomen is protuberant. Bowel sounds are normal. There is no distension.     Palpations: Abdomen is soft. There is no hepatomegaly, splenomegaly or mass.     Tenderness: There is no abdominal tenderness.  Musculoskeletal:        General: Normal range of motion.     Cervical back: Neck supple.  Right lower leg: No edema.     Left lower leg: No edema.  Lymphadenopathy:     Cervical: No cervical adenopathy.  Skin:    General: Skin is warm and dry.     Coloration: Skin is not pale.  Neurological:     General: No focal deficit present.     Mental Status: She is alert.  Psychiatric:        Mood and Affect: Mood normal.        Behavior: Behavior normal.     Lab Results  Component Value Date   WBC 8.1 03/21/2020   HGB 13.3 03/21/2020   HCT 39.7 03/21/2020   PLT 230 03/21/2020   GLUCOSE 87 03/21/2020   CHOL 178 03/21/2020   TRIG 91 03/21/2020   HDL 48 (L) 03/21/2020    LDLCALC 111 (H) 03/21/2020   ALT 16 03/21/2020   AST 19 03/21/2020   NA 139 03/21/2020   K 4.2 03/21/2020   CL 105 03/21/2020   CREATININE 0.79 03/21/2020   BUN 12 03/21/2020   CO2 27 03/21/2020   TSH 1.44 03/21/2020   HGBA1C 5.7 (H) 03/21/2020    Patient was never admitted.  Assessment & Plan:   [name] was seen today for hypertension.  Diagnoses and all orders for this visit:  Loud snoring -     Ambulatory referral to Sleep Studies  Essential hypertension- Her blood pressure is not adequately well controlled.  I will increase the dose of indapamide.  I have encouraged her to improve her lifestyle modifications and to be screened for sleep apnea. -     indapamide (LOZOL) 2.5 MG tablet; Take 1 tablet (2.5 mg total) by mouth daily.  Colon cancer screening -     Cologuard   I have discontinued Brittannie Fetch's indapamide. I am also having her start on indapamide. Additionally, I am having her maintain her Cholecalciferol and TURMERIC PO.  Meds ordered this encounter  Medications  . indapamide (LOZOL) 2.5 MG tablet    Sig: Take 1 tablet (2.5 mg total) by mouth daily.    Dispense:  90 tablet    Refill:  0     Follow-up: Return in about 3 months (around 08/02/2020).  Sanda Linger, MD

## 2020-05-02 NOTE — Patient Instructions (Addendum)
Schedule mammogram! [facility] [phone] [address]  [place], [place] [address]  Health Maintenance, Female Adopting a healthy lifestyle and getting preventive care are important in promoting health and wellness. Ask your health care provider about:  The right schedule for you to have regular tests and exams.  Things you can do on your own to prevent diseases and keep yourself healthy. What should I know about diet, weight, and exercise? Eat a healthy diet   Eat a diet that includes plenty of vegetables, fruits, low-fat dairy products, and lean protein.  Do not eat a lot of foods that are high in solid fats, added sugars, or sodium. Maintain a healthy weight Body mass index (BMI) is used to identify weight problems. It estimates body fat based on height and weight. Your health care provider can help determine your BMI and help you achieve or maintain a healthy weight. Get regular exercise Get regular exercise. This is one of the most important things you can do for your health. Most adults should:  Exercise for at least 150 minutes each week. The exercise should increase your heart rate and make you sweat (moderate-intensity exercise).  Do strengthening exercises at least twice a week. This is in addition to the moderate-intensity exercise.  Spend less time sitting. Even light physical activity can be beneficial. Watch cholesterol and blood lipids Have your blood tested for lipids and cholesterol at 47 years of age, then have this test every 5 years. Have your cholesterol levels checked more often if:  Your lipid or cholesterol levels are high.  You are older than 47 years of age.  You are at high risk for heart disease. What should I know about cancer screening? Depending on your health history and family history, you may need to have cancer screening at various ages. This may include screening for:  Breast cancer.  Cervical  cancer.  Colorectal cancer.  Skin cancer.  Lung cancer. What should I know about heart disease, diabetes, and high blood pressure? Blood pressure and heart disease  High blood pressure causes heart disease and increases the risk of stroke. This is more likely to develop in people who have high blood pressure readings, are of African descent, or are overweight.  Have your blood pressure checked: ? Every 3-5 years if you are 18-39 years of age. ? Every year if you are 40 years old or older. Diabetes Have regular diabetes screenings. This checks your fasting blood sugar level. Have the screening done:  Once every three years after age 40 if you are at a normal weight and have a low risk for diabetes.  More often and at a younger age if you are overweight or have a high risk for diabetes. What should I know about preventing infection? Hepatitis B If you have a higher risk for hepatitis B, you should be screened for this virus. Talk with your health care provider to find out if you are at risk for hepatitis B infection. Hepatitis C Testing is recommended for:  Everyone born from 1945 through 1965.  Anyone with known risk factors for hepatitis C. Sexually transmitted infections (STIs)  Get screened for STIs, including gonorrhea and chlamydia, if: ? You are sexually active and are younger than 47 years of age. ? You are older than 47 years of age and your health care provider tells you that you are at risk for this type of infection. ? Your sexual activity has changed since you   were last screened, and you are at increased risk for chlamydia or gonorrhea. Ask your health care provider if you are at risk.  Ask your health care provider about whether you are at high risk for HIV. Your health care provider may recommend a prescription medicine to help prevent HIV infection. If you choose to take medicine to prevent HIV, you should first get tested for HIV. You should then be tested every 3  months for as long as you are taking the medicine. Pregnancy  If you are about to stop having your period (premenopausal) and you may become pregnant, seek counseling before you get pregnant.  Take 400 to 800 micrograms (mcg) of folic acid every day if you become pregnant.  Ask for birth control (contraception) if you want to prevent pregnancy. Osteoporosis and menopause Osteoporosis is a disease in which the bones lose minerals and strength with aging. This can result in bone fractures. If you are 65 years old or older, or if you are at risk for osteoporosis and fractures, ask your health care provider if you should:  Be screened for bone loss.  Take a calcium or vitamin D supplement to lower your risk of fractures.  Be given hormone replacement therapy (HRT) to treat symptoms of menopause. Follow these instructions at home: Lifestyle  Do not use any products that contain nicotine or tobacco, such as cigarettes, e-cigarettes, and chewing tobacco. If you need help quitting, ask your health care provider.  Do not use street drugs.  Do not share needles.  Ask your health care provider for help if you need support or information about quitting drugs. Alcohol use  Do not drink alcohol if: ? Your health care provider tells you not to drink. ? You are pregnant, may be pregnant, or are planning to become pregnant.  If you drink alcohol: ? Limit how much you use to 0-1 drink a day. ? Limit intake if you are breastfeeding.  Be aware of how much alcohol is in your drink. In the [place] one drink equals one 12 oz bottle of beer (355 mL), one 5 oz glass of wine (148 mL), or one 1 oz glass of hard liquor (44 mL). General instructions  Schedule regular health, dental, and eye exams.  Stay current with your vaccines.  Tell your health care provider if: ? You often feel depressed. ? You have ever been abused or do not feel safe at home. Summary  Adopting a healthy lifestyle and getting  preventive care are important in promoting health and wellness.  Follow your health care provider's instructions about healthy diet, exercising, and getting tested or screened for diseases.  Follow your health care provider's instructions on monitoring your cholesterol and blood pressure. This information is not intended to replace advice given to you by your health care provider. Make sure you discuss any questions you have with your health care provider. Document Revised: 07/02/2018 Document Reviewed: 07/02/2018 Elsevier Patient Education  2020 [employer]  

## 2020-05-02 NOTE — Patient Instructions (Signed)

## 2020-05-03 LAB — HIV ANTIBODY (ROUTINE TESTING W REFLEX): HIV 1&2 Ab, 4th Generation: NONREACTIVE

## 2020-05-03 LAB — RPR: RPR Ser Ql: NONREACTIVE

## 2020-05-04 ENCOUNTER — Encounter (HOSPITAL_COMMUNITY): Payer: Federal, State, Local not specified - PPO | Admitting: Physical Therapy

## 2020-05-04 LAB — C. TRACHOMATIS/N. GONORRHOEAE RNA
C. trachomatis RNA, TMA: NOT DETECTED
N. gonorrhoeae RNA, TMA: NOT DETECTED

## 2020-05-06 ENCOUNTER — Encounter (HOSPITAL_COMMUNITY): Payer: Federal, State, Local not specified - PPO | Admitting: Physical Therapy

## 2020-05-11 ENCOUNTER — Ambulatory Visit: Payer: Federal, State, Local not specified - PPO | Admitting: Family Medicine

## 2020-05-11 LAB — PAP, TP IMAGING W/ HPV RNA, RFLX HPV TYPE 16,18/45: HPV DNA High Risk: DETECTED — AB

## 2020-05-11 LAB — HPV TYPE 16 AND 18/45 RNA
HPV Type 16 RNA: NOT DETECTED
HPV Type 18/45 RNA: NOT DETECTED

## 2020-05-13 LAB — COLOGUARD
Cologuard: NEGATIVE
Cologuard: NEGATIVE

## 2020-05-26 LAB — EXTERNAL GENERIC LAB PROCEDURE: COLOGUARD: NEGATIVE

## 2020-05-26 LAB — COLOGUARD: COLOGUARD: NEGATIVE

## 2020-07-13 ENCOUNTER — Other Ambulatory Visit: Payer: Self-pay | Admitting: Nurse Practitioner

## 2020-07-13 DIAGNOSIS — Z1231 Encounter for screening mammogram for malignant neoplasm of breast: Secondary | ICD-10-CM

## 2020-08-05 DIAGNOSIS — H524 Presbyopia: Secondary | ICD-10-CM | POA: Diagnosis not present

## 2020-08-05 DIAGNOSIS — H26102 Unspecified traumatic cataract, left eye: Secondary | ICD-10-CM | POA: Diagnosis not present

## 2020-08-22 ENCOUNTER — Ambulatory Visit
Admission: RE | Admit: 2020-08-22 | Discharge: 2020-08-22 | Disposition: A | Discharge status: Home / Self Care | Payer: Federal, State, Local not specified - PPO | Source: Ambulatory Visit | Attending: Nurse Practitioner | Admitting: Nurse Practitioner

## 2020-08-22 ENCOUNTER — Other Ambulatory Visit: Payer: Self-pay

## 2020-08-22 DIAGNOSIS — Z1231 Encounter for screening mammogram for malignant neoplasm of breast: Secondary | ICD-10-CM

## 2021-03-16 ENCOUNTER — Telehealth: Payer: Self-pay | Admitting: Internal Medicine

## 2021-03-16 NOTE — Telephone Encounter (Signed)
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  Please mail letter to: [address]  [place] [address]  Call patient and let her know when letter has been mailed.

## 2021-03-17 ENCOUNTER — Other Ambulatory Visit: Payer: Self-pay | Admitting: Internal Medicine

## 2021-03-17 ENCOUNTER — Encounter: Payer: Self-pay | Admitting: Internal Medicine

## 2021-03-21 ENCOUNTER — Encounter: Payer: Self-pay | Admitting: Internal Medicine

## 2021-04-03 ENCOUNTER — Other Ambulatory Visit: Payer: Self-pay

## 2021-04-03 NOTE — Progress Notes (Signed)
Subjective:    Patient ID: Kelly Vega, female    DOB: [birth year], 50 y.o.   MRN: [id number]  This visit occurred during the SARS-CoV-2 public health emergency.  Safety protocols were in place, including screening questions prior to the visit, additional usage of staff PPE, and extensive cleaning of exam room while observing appropriate contact time as indicated for disinfecting solutions.    HPI The patient is here for an acute visit.   Left knee pain - she injured her left knee on July 17th.  She fell onto the left knee.  She was not able to walk initially and had difficulty bearing weight on her leg for a while.  She put ice on it and elevated it.  She took ibuprofen as needed.  It has gotten better, but she still has some pain.    She denies pain with walking.  The knee feels unstable and not as strong.  She denies swelling in the knee.  She has discomfort in the posterior knee - that is intermittent.  She has good range of motion.  She denies calf pain.    She had an x-ray done just over a year ago that showed minimal arthritis throughout her knee.  Medications and allergies reviewed with patient and updated if appropriate.  Patient Active Problem List   Diagnosis Date Noted   Loud snoring 05/02/2020   Vitamin D insufficiency 03/22/2020   Need for hepatitis C screening test 03/21/2020   Routine general medical examination at a health care facility 03/21/2020   Cervical cancer screening 03/21/2020   Morbid obesity (HCC) 03/21/2020   Essential hypertension 03/21/2020   Chronic pain of left knee 03/21/2020   Post-traumatic osteoarthritis of left knee 03/21/2020    Current Outpatient Medications on File Prior to Visit  Medication Sig Dispense Refill   Cholecalciferol 50 MCG (2000 UT) TABS Take 1 tablet (2,000 Units total) by mouth daily. 90 tablet 1   indapamide (LOZOL) 2.5 MG tablet Take 1 tablet (2.5 mg total) by mouth daily. 90 tablet 0   TURMERIC PO Take 750 mg by mouth  daily.     No current facility-administered medications on file prior to visit.    Past Medical History:  Diagnosis Date   Hypertension     Past Surgical History:  Procedure Laterality Date   CESAREAN SECTION      Social History   Socioeconomic History   Marital status: Single    Spouse name: Not on file   Number of children: Not on file   Years of education: Not on file   Highest education level: Not on file  Occupational History   Not on file  Tobacco Use   Smoking status: Never   Smokeless tobacco: Never  Vaping Use   Vaping Use: Never used  Substance and Sexual Activity   Alcohol use: Yes    Comment: rare   Drug use: Never   Sexual activity: Yes    Partners: Male    Birth control/protection: Condom    Comment: 1st intercourse 42 yo-5 partners  Other Topics Concern   Not on file  Social History Narrative   Not on file   Social Determinants of Health   Financial Resource Strain: Not on file  Food Insecurity: Not on file  Transportation Needs: Not on file  Physical Activity: Not on file  Stress: Not on file  Social Connections: Not on file    Family History  Problem Relation Age of Onset  Diabetes Mother    Hypertension Mother    Breast cancer Mother    Hypertension Father    Alcoholism Father    CAD Father     Review of Systems     Objective:   Vitals:   04/04/21 0946  BP: 134/88  Pulse: 83  Temp: 98.4 F (36.9 C)  SpO2: 98%   BP Readings from Last 3 Encounters:  04/04/21 134/88  05/02/20 (!) 144/88  05/02/20 (!) 162/96   Wt Readings from Last 3 Encounters:  04/04/21 268 lb (121.6 kg)  05/02/20 279 lb (126.6 kg)  05/02/20 280 lb (127 kg)   Body mass index is 41.97 kg/m.   Physical Exam    A left knee exam was performed.   SKIN: intact, no bruising SWELLING: None EFFUSION: No obvious effusion WARMTH: no warmth  TENDERNESS: Minimal tenderness in the posterior aspect of knee  ROM: full extension, full flexion  GAIT:  walks with a mild limp - unable to fully bear weight on left leg with confidence NEUROLOGICAL EXAM: normal sensation  CALF TENDERNESS: no       Assessment & Plan:    Left knee pain: Acute on chronic She has known mild arthritis throughout her knee and has done physical therapy in the past and that did help at the time Injured her knee 2 months ago and is still having some instability and discomfort in the posterior knee Will hold off on repeating an x-ray since I do not think it is necessary Meloxicam 15 mg daily for 2 weeks Ice the knee Do the physical therapy exercises at home Will see Dr. Denyse Amass if her symptoms do not improve since she is seen in the past

## 2021-04-04 ENCOUNTER — Encounter: Payer: Self-pay | Admitting: Internal Medicine

## 2021-04-04 ENCOUNTER — Ambulatory Visit: Payer: Federal, State, Local not specified - PPO | Admitting: Internal Medicine

## 2021-04-04 VITALS — BP 134/88 | HR 83 | Temp 98.4°F | Ht 67.0 in | Wt 268.0 lb

## 2021-04-04 DIAGNOSIS — M25562 Pain in left knee: Secondary | ICD-10-CM | POA: Diagnosis not present

## 2021-04-04 MED ORDER — MELOXICAM 15 MG PO TABS: 15.0000 mg | ORAL_TABLET | Freq: Every day | ORAL | 0 refills | Status: DC

## 2021-04-04 NOTE — Patient Instructions (Addendum)
  Your knee pain is likely a flare of your arthritis.  Try icing your knee.  Do your physical therapy exercises.     Medications changes include :   meloxicam 15 mg daily with food x 2 weeks.  Your prescription(s) have been submitted to your pharmacy. Please take as directed and contact our office if you believe you are having problem(s) with the medication(s).   See Dr Denyse Amass downstairs if your symptoms are not improving.

## 2021-05-03 ENCOUNTER — Encounter: Payer: Self-pay | Admitting: Nurse Practitioner

## 2021-05-03 ENCOUNTER — Other Ambulatory Visit: Payer: Self-pay

## 2021-05-03 ENCOUNTER — Other Ambulatory Visit (HOSPITAL_COMMUNITY)
Admission: RE | Admit: 2021-05-03 | Discharge: 2021-05-03 | Disposition: A | Discharge status: Home / Self Care | Payer: Federal, State, Local not specified - PPO | Source: Ambulatory Visit | Attending: Nurse Practitioner | Admitting: Nurse Practitioner

## 2021-05-03 ENCOUNTER — Ambulatory Visit (INDEPENDENT_AMBULATORY_CARE_PROVIDER_SITE_OTHER): Payer: Federal, State, Local not specified - PPO | Admitting: Nurse Practitioner

## 2021-05-03 VITALS — BP 124/82 | Ht 66.0 in | Wt 271.0 lb

## 2021-05-03 DIAGNOSIS — R87810 Cervical high risk human papillomavirus (HPV) DNA test positive: Secondary | ICD-10-CM | POA: Diagnosis not present

## 2021-05-03 DIAGNOSIS — R87610 Atypical squamous cells of undetermined significance on cytologic smear of cervix (ASC-US): Secondary | ICD-10-CM

## 2021-05-03 DIAGNOSIS — N951 Menopausal and female climacteric states: Secondary | ICD-10-CM | POA: Diagnosis not present

## 2021-05-03 DIAGNOSIS — Z01419 Encounter for gynecological examination (general) (routine) without abnormal findings: Secondary | ICD-10-CM

## 2021-05-03 NOTE — Progress Notes (Signed)
   Kelly Vega 12/12/1970 [id number]   History:  50 y.o. [id number] presents today for annual exam. Perimenopausal. LMP April 2022. Denies menopausal symptoms. 04/2020 ASCUS positive HR HPV negative 16/18/45. HTN, OA managed by PCP.   Gynecologic History Patient's last menstrual period was 11/06/2020.   Sexually active: Yes  Health Maintenance Last Pap: 05/02/2020. Results were: ASCUS positive HR HPV negative 16/18/45 Last mammogram: 08/22/2020. Results were: Normal Last colonoscopy: Never. Negative Cologuard 04/2020 Last Dexa: Not indicated   Past medical history, past surgical history, family history and social history were all reviewed and documented in the EPIC chart. Divorced. 40 yo daughter. Works from home. Mother with history of breast cancer at age 19.  ROS:  A ROS was performed and pertinent positives and negatives are included.  Exam:  Vitals:   05/03/21 0922  BP: 124/82  Weight: 271 lb (122.9 kg)  Height: 5\' 6"  (1.676 m)    Body mass index is 43.74 kg/m.  General appearance:  Normal Thyroid:  Symmetrical, normal in size, without palpable masses or nodularity. Respiratory  Auscultation:  Clear without wheezing or rhonchi Cardiovascular  Auscultation:  Regular rate, without rubs, murmurs or gallops  Edema/varicosities:  Not grossly evident Abdominal  Soft,nontender, without masses, guarding or rebound.  Liver/spleen:  No organomegaly noted  Hernia:  None appreciated  Skin  Inspection:  Grossly normal   Breasts: Examined lying and sitting.   Right: Without masses, retractions, discharge or axillary adenopathy.   Left: Without masses, retractions, discharge or axillary adenopathy. Genitourinary   Inguinal/mons:  Normal without inguinal adenopathy  External genitalia:  Normal appearing vulva with no masses, tenderness, or lesions  BUS/Urethra/Skene's glands:  Normal  Vagina:  Normal appearing with normal color and discharge, no lesions  Cervix:  Normal  appearing without discharge or lesions  Uterus:  Difficult to palpate due to body habitus but not gross masses or tenderness  Adnexa/parametria:     Rt: Normal in size, without masses or tenderness.   Lt: Normal in size, without masses or tenderness.  Anus and perineum: Normal  Digital rectal exam: Normal sphincter tone without palpated masses or tenderness  Patient informed chaperone available to be present for breast and pelvic exam. Patient has requested no chaperone to be present. Patient has been advised what will be completed during breast and pelvic exam.   Assessment/Plan:  50 y.o. 54 for annual exam.   Well female exam with routine gynecological exam - Plan: Cytology - PAP( [place]). Education provided on SBEs, importance of preventative screenings, current guidelines, high calcium diet, regular exercise, and multivitamin daily.  Labs with PCP.   Perimenopause - LMP April 2022. Denies menopausal symptoms.   ASCUS with positive high risk HPV cervical - Plan: Cytology - PAP( [place]). Pap 04/2020. Normal paps prior. Repeat pap today.   Screening for breast cancer - Normal mammogram history.  Continue annual screenings.  Normal breast exam today.  Screening for colon cancer - Negative Cologuard 04/2020.  Follow-up in 1 year for annual.     05/2020 [facility], 9:54 AM 05/03/2021

## 2021-05-05 LAB — CYTOLOGY - PAP
Adequacy: ABSENT
Comment: NEGATIVE
Diagnosis: NEGATIVE
High risk HPV: POSITIVE — AB

## 2021-05-08 ENCOUNTER — Other Ambulatory Visit: Payer: Self-pay | Admitting: Nurse Practitioner

## 2021-05-08 DIAGNOSIS — R87610 Atypical squamous cells of undetermined significance on cytologic smear of cervix (ASC-US): Secondary | ICD-10-CM

## 2021-06-14 ENCOUNTER — Ambulatory Visit: Payer: Federal, State, Local not specified - PPO | Admitting: Obstetrics and Gynecology

## 2021-07-19 ENCOUNTER — Other Ambulatory Visit (HOSPITAL_COMMUNITY)
Admission: RE | Admit: 2021-07-19 | Discharge: 2021-07-19 | Disposition: A | Discharge status: Home / Self Care | Payer: Federal, State, Local not specified - PPO | Source: Ambulatory Visit | Attending: Nurse Practitioner | Admitting: Nurse Practitioner

## 2021-07-19 ENCOUNTER — Other Ambulatory Visit: Payer: Self-pay

## 2021-07-19 ENCOUNTER — Encounter: Payer: Self-pay | Admitting: Obstetrics and Gynecology

## 2021-07-19 ENCOUNTER — Ambulatory Visit: Payer: Federal, State, Local not specified - PPO | Admitting: Obstetrics and Gynecology

## 2021-07-19 VITALS — BP 130/82 | HR 88 | Ht 67.0 in | Wt 280.0 lb

## 2021-07-19 DIAGNOSIS — L680 Hirsutism: Secondary | ICD-10-CM | POA: Diagnosis not present

## 2021-07-19 DIAGNOSIS — E7800 Pure hypercholesterolemia, unspecified: Secondary | ICD-10-CM | POA: Diagnosis not present

## 2021-07-19 DIAGNOSIS — A630 Anogenital (venereal) warts: Secondary | ICD-10-CM | POA: Diagnosis not present

## 2021-07-19 DIAGNOSIS — R7303 Prediabetes: Secondary | ICD-10-CM

## 2021-07-19 DIAGNOSIS — N911 Secondary amenorrhea: Secondary | ICD-10-CM | POA: Diagnosis not present

## 2021-07-19 DIAGNOSIS — Z0000 Encounter for general adult medical examination without abnormal findings: Secondary | ICD-10-CM | POA: Diagnosis not present

## 2021-07-19 DIAGNOSIS — Z6841 Body Mass Index (BMI) 40.0 and over, adult: Secondary | ICD-10-CM

## 2021-07-19 NOTE — Progress Notes (Signed)
GYNECOLOGY  VISIT   HPI: 50 y.o.   Single Black or African American Not Hispanic or Latino  female   [phone] with Patient's last menstrual period was 06/03/2021.   here for colposcopy. Recent pap from 10/22 returned as WNL, +HPV. Prior pap from 10/21 ASCUS, +HPV, negative 16/18/45.  She had a cycle in 4/22, then nothing until 11/22. Normal cycle, but lighter than normal. She is having vasomotor changes and mood changes for 2-3 years.  Prior to 4/22, cycles were irregular, ~ every other month.  No major weight changes. No thyroid c/o.   She hasn't had blood work in over a year. Prediabetic, h/o abnormal lipids  She c/o an increase of hair growth on her chin, needs to shave.   Not currently sexually active, not since 3/22. Used condoms.   She reports a h/o fibroids.   GYNECOLOGIC HISTORY: Patient's last menstrual period was 06/03/2021. Contraception:not sexually active  Menopausal hormone therapy: none         OB History     Gravida  4   Para  1   Term      Preterm      AB  3   Living  1      SAB  3   IAB      Ectopic      Multiple      Live Births                 Patient Active Problem List   Diagnosis Date Noted   Loud snoring 05/02/2020   Vitamin D insufficiency 03/22/2020   Need for hepatitis C screening test 03/21/2020   Routine general medical examination at a health care facility 03/21/2020   Cervical cancer screening 03/21/2020   Morbid obesity (HCC) 03/21/2020   Essential hypertension 03/21/2020   Chronic pain of left knee 03/21/2020   Post-traumatic osteoarthritis of left knee 03/21/2020    Past Medical History:  Diagnosis Date   Hypertension     Past Surgical History:  Procedure Laterality Date   CESAREAN SECTION      Current Outpatient Medications  Medication Sig Dispense Refill   Cholecalciferol 50 MCG (2000 UT) TABS Take 1 tablet (2,000 Units total) by mouth daily. 90 tablet 1   TURMERIC PO Take 750 mg by mouth daily.      No current facility-administered medications for this visit.     ALLERGIES: Patient has no known allergies.  Family History  Problem Relation Age of Onset   Diabetes Mother    Hypertension Mother    Breast cancer Mother    Hypertension Father    Alcoholism Father    CAD Father     Social History   Socioeconomic History   Marital status: Single    Spouse name: Not on file   Number of children: Not on file   Years of education: Not on file   Highest education level: Not on file  Occupational History   Not on file  Tobacco Use   Smoking status: Never   Smokeless tobacco: Never  Vaping Use   Vaping Use: Never used  Substance and Sexual Activity   Alcohol use: Yes    Comment: rare   Drug use: Never   Sexual activity: Not Currently    Partners: Male    Birth control/protection: Condom    Comment: 1st intercourse 36 yo-5 partners  Other Topics Concern   Not on file  Social History Narrative   Not on file  Social Determinants of Health   Financial Resource Strain: Not on file  Food Insecurity: Not on file  Transportation Needs: Not on file  Physical Activity: Not on file  Stress: Not on file  Social Connections: Not on file  Intimate Partner Violence: Not on file    Review of Systems  All other systems reviewed and are negative.  PHYSICAL EXAMINATION:    BP 130/82    Pulse 88    Ht 5\' 7"  (1.702 m)    Wt 280 lb (127 kg)    LMP 06/03/2021    SpO2 98%    BMI 43.85 kg/m     General appearance: alert, cooperative and appears stated age   Pelvic: External genitalia:  no lesions              Urethra:  normal appearing urethra with no masses, tenderness or lesions              Bartholins and Skenes: normal                 Vagina: normal appearing vagina with normal color and discharge, no lesions              Cervix: no lesions  Colposcopy: unsatisfactory, no aceto-white changes. Negative lugols examination of the cervix and upper vagina, ECC done.                  Chaperone was present for exam.  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 Amenorrhea, secondary She went 7 months without bleeding and then bleed. Evaluation is warranted, we discussed that her BMI increases her risk of endometrial pathology. -I reviewed the normal menstrual cycle and with a diagram showed her how the lining of the uterus can grow abnormally during perimenopause. - TSH - Follicle stimulating hormone - Prolactin - Endometrial biopsy; Future  3. Hirsutism New hair growth - Testosterone, Total, LC/MS/MS - 17-Hydroxyprogesterone  4. Prediabetes Overdue for lab work - Hemoglobin A1c  5. Laboratory exam ordered as part of routine general medical examination - CBC - Comprehensive metabolic panel  6. Elevated LDL cholesterol level - Lipid panel  7. BMI 40.0-44.9, adult [facility]) This increases her risk of endometrial pathology in addition to multiple other health problems.  In addition to the colposcopy over 20 minutes was spent in patient care collecting history and discussing evaluation and possible management of her multiple other issues.

## 2021-07-19 NOTE — Patient Instructions (Signed)

## 2021-07-21 LAB — SURGICAL PATHOLOGY

## 2021-07-22 LAB — COMPREHENSIVE METABOLIC PANEL
AG Ratio: 1.2 (calc) (ref 1.0–2.5)
ALT: 11 U/L (ref 6–29)
AST: 15 U/L (ref 10–35)
Albumin: 3.9 g/dL (ref 3.6–5.1)
Alkaline phosphatase (APISO): 85 U/L (ref 31–125)
BUN: 8 mg/dL (ref 7–25)
CO2: 24 mmol/L (ref 20–32)
Calcium: 8.7 mg/dL (ref 8.6–10.2)
Chloride: 107 mmol/L (ref 98–110)
Creat: 0.81 mg/dL (ref 0.50–0.99)
Globulin: 3.2 g/dL (calc) (ref 1.9–3.7)
Glucose, Bld: 90 mg/dL (ref 65–99)
Potassium: 3.9 mmol/L (ref 3.5–5.3)
Sodium: 140 mmol/L (ref 135–146)
Total Bilirubin: 0.3 mg/dL (ref 0.2–1.2)
Total Protein: 7.1 g/dL (ref 6.1–8.1)

## 2021-07-22 LAB — CBC
HCT: 38.8 % (ref 35.0–45.0)
Hemoglobin: 13.4 g/dL (ref 11.7–15.5)
MCH: 30.7 pg (ref 27.0–33.0)
MCHC: 34.5 g/dL (ref 32.0–36.0)
MCV: 88.8 fL (ref 80.0–100.0)
MPV: 10.9 fL (ref 7.5–12.5)
Platelets: 223 10*3/uL (ref 140–400)
RBC: 4.37 10*6/uL (ref 3.80–5.10)
RDW: 12.8 % (ref 11.0–15.0)
WBC: 7.5 10*3/uL (ref 3.8–10.8)

## 2021-07-22 LAB — LIPID PANEL
Cholesterol: 171 mg/dL (ref <200)
HDL: 41 mg/dL — ABNORMAL LOW (ref >=50)
LDL Cholesterol (Calc): 109 mg/dL (calc) — ABNORMAL HIGH
Non-HDL Cholesterol (Calc): 130 mg/dL (calc) — ABNORMAL HIGH (ref <130)
Total CHOL/HDL Ratio: 4.2 (calc) (ref <5.0)
Triglycerides: 105 mg/dL (ref <150)

## 2021-07-22 LAB — 17-HYDROXYPROGESTERONE: 17-OH-Progesterone, LC/MS/MS: 46 ng/dL

## 2021-07-22 LAB — HEMOGLOBIN A1C
Hgb A1c MFr Bld: 5.6 % of total Hgb (ref <5.7)
Mean Plasma Glucose: 114 mg/dL
eAG (mmol/L): 6.3 mmol/L

## 2021-07-22 LAB — TESTOSTERONE, TOTAL, LC/MS/MS: Testosterone, Total, LC-MS-MS: 24 ng/dL (ref 2–45)

## 2021-07-22 LAB — TSH: TSH: 1.37 mIU/L

## 2021-07-22 LAB — FOLLICLE STIMULATING HORMONE: FSH: 32.1 m[IU]/mL

## 2021-07-22 LAB — PROLACTIN: Prolactin: 4.5 ng/mL

## 2021-07-25 ENCOUNTER — Other Ambulatory Visit: Payer: Self-pay | Admitting: Nurse Practitioner

## 2021-07-25 DIAGNOSIS — Z1231 Encounter for screening mammogram for malignant neoplasm of breast: Secondary | ICD-10-CM

## 2021-08-08 ENCOUNTER — Other Ambulatory Visit: Payer: Self-pay

## 2021-08-08 DIAGNOSIS — N911 Secondary amenorrhea: Secondary | ICD-10-CM

## 2021-08-11 ENCOUNTER — Ambulatory Visit: Payer: Federal, State, Local not specified - PPO | Admitting: Obstetrics and Gynecology

## 2021-08-31 ENCOUNTER — Ambulatory Visit
Admission: RE | Admit: 2021-08-31 | Discharge: 2021-08-31 | Disposition: A | Discharge status: Home / Self Care | Payer: Federal, State, Local not specified - PPO | Source: Ambulatory Visit | Attending: Nurse Practitioner | Admitting: Nurse Practitioner

## 2021-08-31 DIAGNOSIS — Z1231 Encounter for screening mammogram for malignant neoplasm of breast: Secondary | ICD-10-CM | POA: Diagnosis not present

## 2021-09-11 ENCOUNTER — Encounter: Payer: Self-pay | Admitting: Obstetrics and Gynecology

## 2021-09-11 ENCOUNTER — Other Ambulatory Visit (HOSPITAL_COMMUNITY)
Admission: RE | Admit: 2021-09-11 | Discharge: 2021-09-11 | Disposition: A | Discharge status: Home / Self Care | Payer: Federal, State, Local not specified - PPO | Source: Ambulatory Visit | Attending: Obstetrics and Gynecology | Admitting: Obstetrics and Gynecology

## 2021-09-11 ENCOUNTER — Ambulatory Visit: Payer: Federal, State, Local not specified - PPO | Admitting: Obstetrics and Gynecology

## 2021-09-11 ENCOUNTER — Other Ambulatory Visit: Payer: Self-pay

## 2021-09-11 VITALS — BP 136/76 | HR 72 | Resp 12 | Ht 67.0 in | Wt 278.0 lb

## 2021-09-11 DIAGNOSIS — N858 Other specified noninflammatory disorders of uterus: Secondary | ICD-10-CM | POA: Diagnosis not present

## 2021-09-11 DIAGNOSIS — N914 Secondary oligomenorrhea: Secondary | ICD-10-CM | POA: Insufficient documentation

## 2021-09-11 DIAGNOSIS — Z6841 Body Mass Index (BMI) 40.0 and over, adult: Secondary | ICD-10-CM

## 2021-09-11 DIAGNOSIS — Z01812 Encounter for preprocedural laboratory examination: Secondary | ICD-10-CM

## 2021-09-11 DIAGNOSIS — N882 Stricture and stenosis of cervix uteri: Secondary | ICD-10-CM | POA: Diagnosis not present

## 2021-09-11 DIAGNOSIS — N951 Menopausal and female climacteric states: Secondary | ICD-10-CM

## 2021-09-11 LAB — PREGNANCY, URINE: Preg Test, Ur: NEGATIVE

## 2021-09-11 MED ORDER — MEDROXYPROGESTERONE ACETATE 5 MG PO TABS
ORAL_TABLET | ORAL | 1 refills | Status: DC
Start: 2021-09-11 — End: 2022-05-08

## 2021-09-11 NOTE — Patient Instructions (Signed)

## 2021-09-11 NOTE — Progress Notes (Signed)
GYNECOLOGY  VISIT   HPI: 51 y.o.   Single Black or African American Not Hispanic or Latino  female   [phone] with No LMP recorded (approximate). (Menstrual status: Perimenopausal).   here for   endometrial biopsy   She had a cycle in 4/22, then nothing until 11/22. Normal cycle in 11/22, just lighter than normal. She is having vasomotor changes and mood changes for 2-3 years.  Prior to 4/22, cycles were irregular, ~ every other month.  Labs from 07/19/21: normal TSH, normal prolactin, FSH 32  GYNECOLOGIC HISTORY: No LMP recorded (approximate). (Menstrual status: Perimenopausal). Contraception:abstinence- last intercourse 06/2020 Menopausal hormone therapy: none        OB History     Gravida  4   Para  1   Term      Preterm      AB  3   Living  1      SAB  3   IAB      Ectopic      Multiple      Live Births                 Patient Active Problem List   Diagnosis Date Noted   Loud snoring 05/02/2020   Vitamin D insufficiency 03/22/2020   Need for hepatitis C screening test 03/21/2020   Routine general medical examination at a health care facility 03/21/2020   Cervical cancer screening 03/21/2020   Morbid obesity (HCC) 03/21/2020   Essential hypertension 03/21/2020   Chronic pain of left knee 03/21/2020   Post-traumatic osteoarthritis of left knee 03/21/2020    Past Medical History:  Diagnosis Date   Hypertension     Past Surgical History:  Procedure Laterality Date   CESAREAN SECTION      Current Outpatient Medications  Medication Sig Dispense Refill   Cholecalciferol 50 MCG (2000 UT) TABS Take 1 tablet (2,000 Units total) by mouth daily. 90 tablet 1   TURMERIC PO Take 750 mg by mouth daily.     No current facility-administered medications for this visit.     ALLERGIES: Patient has no known allergies.  Family History  Problem Relation Age of Onset   Diabetes Mother    Hypertension Mother    Breast cancer Mother    Hypertension Father     Alcoholism Father    CAD Father     Social History   Socioeconomic History   Marital status: Single    Spouse name: Not on file   Number of children: Not on file   Years of education: Not on file   Highest education level: Not on file  Occupational History   Not on file  Tobacco Use   Smoking status: Never   Smokeless tobacco: Never  Vaping Use   Vaping Use: Never used  Substance and Sexual Activity   Alcohol use: Yes    Comment: rare   Drug use: Never   Sexual activity: Not Currently    Partners: Male    Birth control/protection: Condom    Comment: 1st intercourse 55 yo-5 partners  Other Topics Concern   Not on file  Social History Narrative   Not on file   Social Determinants of Health   Financial Resource Strain: Not on file  Food Insecurity: Not on file  Transportation Needs: Not on file  Physical Activity: Not on file  Stress: Not on file  Social Connections: Not on file  Intimate Partner Violence: Not on file    Review of  Systems  All other systems reviewed and are negative.  PHYSICAL EXAMINATION:    BP 136/76 (BP Location: Left Arm, Patient Position: Sitting, Cuff Size: Normal)    Pulse 72    Resp 12    Ht 5\' 7"  (1.702 m)    Wt 278 lb (126.1 kg)    LMP  (Approximate)    BMI 43.54 kg/m     General appearance: alert, cooperative and appears stated age Pelvic: External genitalia:  no lesions              Urethra:  normal appearing urethra with no masses, tenderness or lesions              Bartholins and Skenes: normal                 Vagina: normal appearing vagina with normal color and discharge, no lesions              Cervix: no lesions and stenotic  The risks of endometrial biopsy were reviewed and a consent was obtained.  A speculum was placed in the vagina and the cervix was cleansed with betadine. The endometrial pipelle could not get through the stenotic cervix.  A tenaculum was placed on the cervix and the cervix was dilated with the  mini-dilators up to the #3 hagar dilator. The pipelle was placed into the endometrial cavity. The uterus sounded to 9 cm. The endometrial biopsy was performed, taking care to get a representative sample, sampling 360 degrees of the uterine cavity. A small amount of tissue was obtained. The tenaculum and speculum were removed. There were no complications.   Karmen Bongo, RN chaperoned   1. Secondary oligomenorrhea - Endometrial biopsy - medroxyPROGESTERone (PROVERA) 5 MG tablet; Take one tablet a day for 5 days every other month if no spontaneous cycles.  Dispense: 15 tablet; Refill: 1 (she will start after the biopsy result returns) - Surgical pathology( [place])  2. Pre-procedure lab exam - Pregnancy, urine  3. BMI 40.0-44.9, adult ([place])  4. Stricture and stenosis of cervix  5. Perimenopause

## 2021-09-13 ENCOUNTER — Encounter: Payer: Self-pay | Admitting: Obstetrics and Gynecology

## 2021-09-13 LAB — SURGICAL PATHOLOGY

## 2022-05-08 ENCOUNTER — Ambulatory Visit (INDEPENDENT_AMBULATORY_CARE_PROVIDER_SITE_OTHER): Payer: Federal, State, Local not specified - PPO | Admitting: Nurse Practitioner

## 2022-05-08 ENCOUNTER — Other Ambulatory Visit (HOSPITAL_COMMUNITY)
Admission: RE | Admit: 2022-05-08 | Discharge: 2022-05-08 | Disposition: A | Discharge status: Home / Self Care | Payer: Federal, State, Local not specified - PPO | Source: Ambulatory Visit | Attending: Nurse Practitioner | Admitting: Nurse Practitioner

## 2022-05-08 ENCOUNTER — Encounter: Payer: Self-pay | Admitting: Nurse Practitioner

## 2022-05-08 VITALS — BP 112/84 | HR 90 | Resp 14 | Ht 65.0 in | Wt 278.0 lb

## 2022-05-08 DIAGNOSIS — Z01419 Encounter for gynecological examination (general) (routine) without abnormal findings: Secondary | ICD-10-CM | POA: Diagnosis not present

## 2022-05-08 DIAGNOSIS — R87810 Cervical high risk human papillomavirus (HPV) DNA test positive: Secondary | ICD-10-CM | POA: Diagnosis not present

## 2022-05-08 DIAGNOSIS — N951 Menopausal and female climacteric states: Secondary | ICD-10-CM

## 2022-05-08 NOTE — Progress Notes (Signed)
   Kelly Vega 1971/05/20 [id number]   History:  51 y.o. [id number] presents today for annual exam. Perimenopausal. LMP 05/2021. Mild menopausal symptoms. 04/2020 ASCUS positive HR HPV negative 16/18/45, 04/2021 normal cytology + HR HPV with benign biopsy. Negative EMB 08/2021. HTN, OA managed by PCP.   Gynecologic History No LMP recorded. (Menstrual status: Perimenopausal).   Contraception/Family planning: abstinence Sexually active: No  Health Maintenance Last Pap: 05/03/2021. Results were: Normal cytology positive HR HPV Last mammogram: 08/31/2021. Results were: Normal Last colonoscopy: Never. Negative Cologuard 04/2020 Last Dexa: Not indicated   Past medical history, past surgical history, family history and social history were all reviewed and documented in the EPIC chart. Divorced. 49 yo daughter in 8th grade, plays basketball. Works from home for [employer]. Mother with history of breast cancer at age 62.  ROS:  A ROS was performed and pertinent positives and negatives are included.  Exam:  Vitals:   05/08/22 0933  BP: 49/84  Pulse: 90  Resp: 14  Weight: 278 lb (126.1 kg)  Height: 5\' 5"  (1.651 m)     Body mass index is 46.26 kg/m.  General appearance:  Normal Thyroid:  Symmetrical, normal in size, without palpable masses or nodularity. Respiratory  Auscultation:  Clear without wheezing or rhonchi Cardiovascular  Auscultation:  Regular rate, without rubs, murmurs or gallops  Edema/varicosities:  Not grossly evident Abdominal  Soft,nontender, without masses, guarding or rebound.  Liver/spleen:  No organomegaly noted  Hernia:  None appreciated  Skin  Inspection:  Grossly normal   Breasts: Examined lying and sitting.   Right: Without masses, retractions, discharge or axillary adenopathy.   Left: Without masses, retractions, discharge or axillary adenopathy. Genitourinary   Inguinal/mons:  Normal without inguinal adenopathy  External genitalia:  Normal appearing vulva  with no masses, tenderness, or lesions  BUS/Urethra/Skene's glands:  Normal  Vagina:  Normal appearing with normal color and discharge, no lesions  Cervix:  Normal appearing without discharge or lesions  Uterus:  Difficult to palpate due to body habitus but not gross masses or tenderness  Adnexa/parametria:     Rt: Normal in size, without masses or tenderness.   Lt: Normal in size, without masses or tenderness.  Anus and perineum: Normal  Digital rectal exam: Normal sphincter tone without palpated masses or tenderness  Patient informed chaperone available to be present for breast and pelvic exam. Patient has requested no chaperone to be present. Patient has been advised what will be completed during breast and pelvic exam.   Assessment/Plan:  51 y.o. G4W1027 for annual exam.   Well female exam with routine gynecological exam - Plan: Cytology - PAP( [place]). Education provided on SBEs, importance of preventative screenings, current guidelines, high calcium diet, regular exercise, and multivitamin daily.  Labs with PCP.   Perimenopausal - LMP 05/2021, mild menopausal symptoms.   Papanicolaou smear of cervix with positive high risk human papilloma virus (HPV) test - Plan: Cytology - PAP ([place]). 04/2020 ASCUS positive HR HPV negative 16/18/45, 04/2021 normal cytology + HR HPV with benign biopsy.  Screening for breast cancer - Normal mammogram history.  Continue annual screenings.  Normal breast exam today.  Screening for colon cancer - Negative Cologuard 04/2020.  Follow-up in 1 year for annual.     Kelly Vega [facility], 9:53 AM 05/08/2022

## 2022-05-17 ENCOUNTER — Other Ambulatory Visit: Payer: Self-pay | Admitting: *Deleted

## 2022-05-17 LAB — CYTOLOGY - PAP
Adequacy: ABSENT
Comment: NEGATIVE
Diagnosis: NEGATIVE
Diagnosis: REACTIVE
High risk HPV: NEGATIVE

## 2022-05-17 MED ORDER — METRONIDAZOLE 500 MG PO TABS: 500.0000 mg | ORAL_TABLET | Freq: Two times a day (BID) | ORAL | 0 refills | Status: DC

## 2022-07-25 DIAGNOSIS — H5213 Myopia, bilateral: Secondary | ICD-10-CM | POA: Diagnosis not present

## 2022-07-25 DIAGNOSIS — H26102 Unspecified traumatic cataract, left eye: Secondary | ICD-10-CM | POA: Diagnosis not present

## 2022-07-31 IMAGING — MG DIGITAL SCREENING BILAT W/ CAD
8 of 9 series · 8 of 9 positions shown · non-contrast
Comparison: None.

CLINICAL DATA: Screening.

EXAM:
DIGITAL SCREENING BILATERAL MAMMOGRAM WITH CAD

[L MLO (1 of 2)]
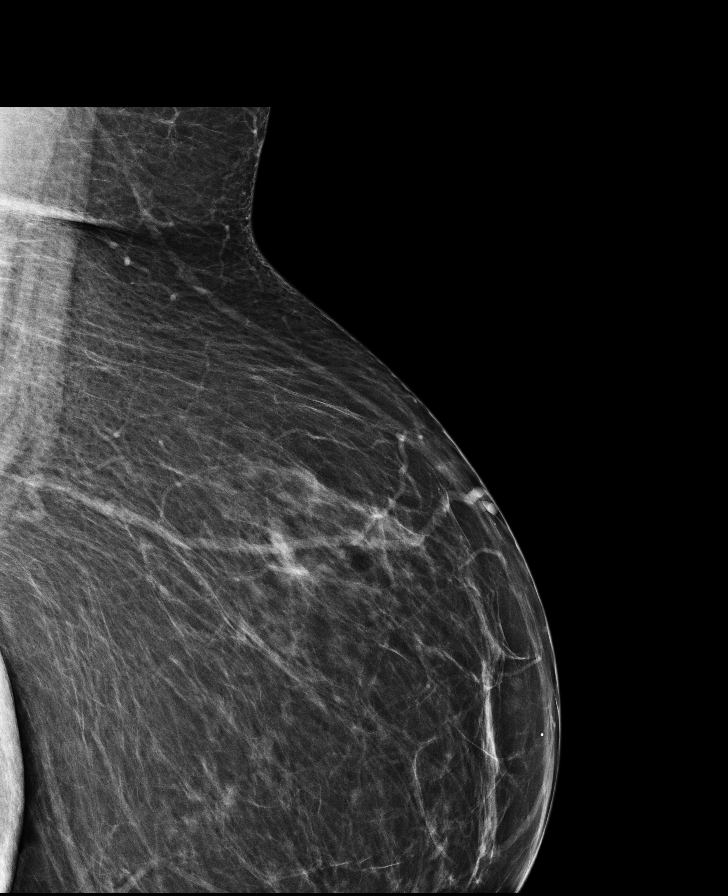

[L MLO (2 of 2)]
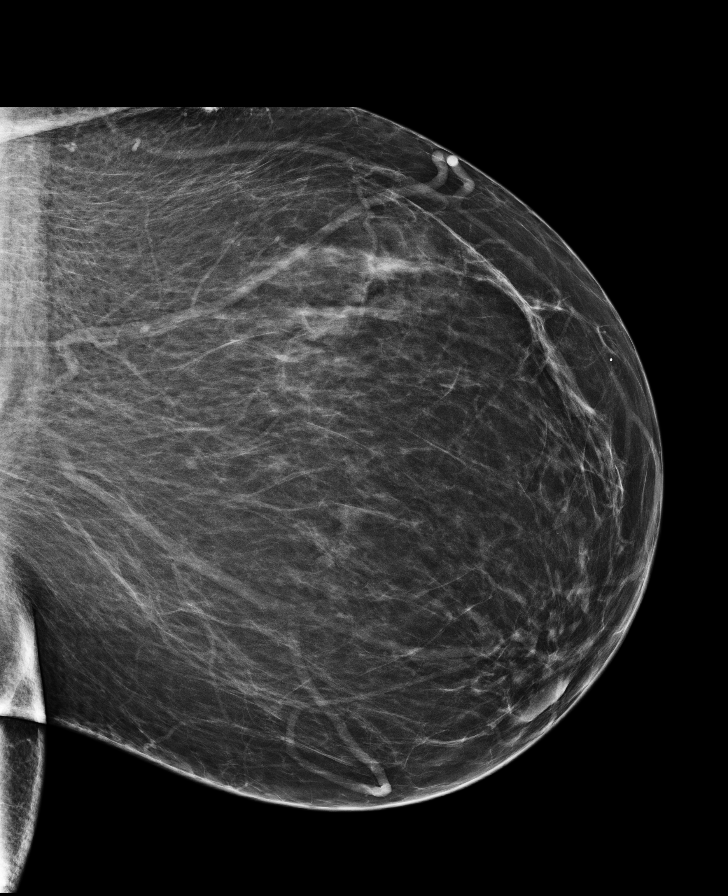

[L CC (1 of 2)]
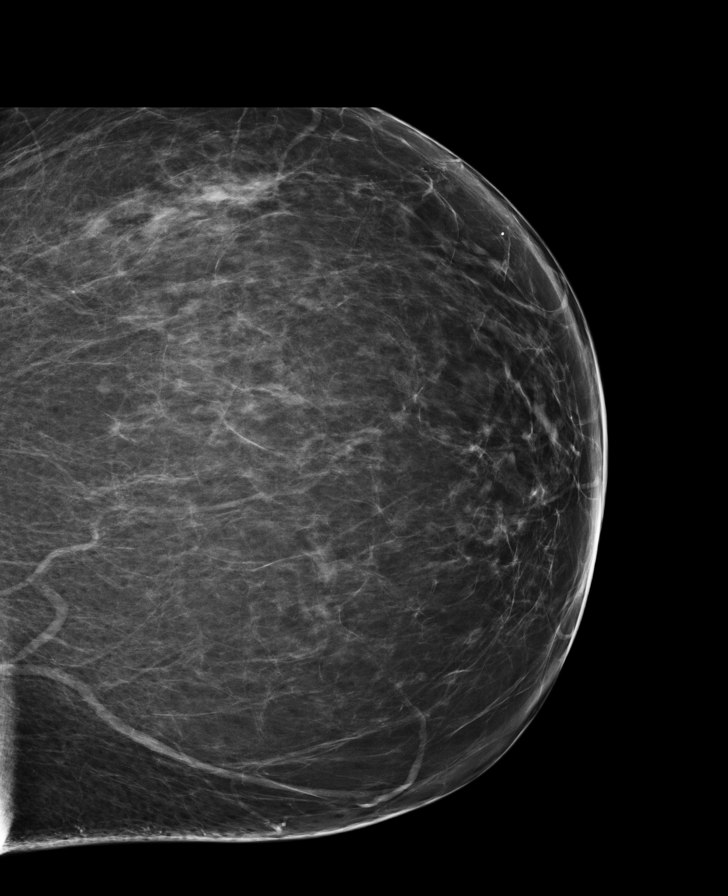

[R CC (1 of 2)]
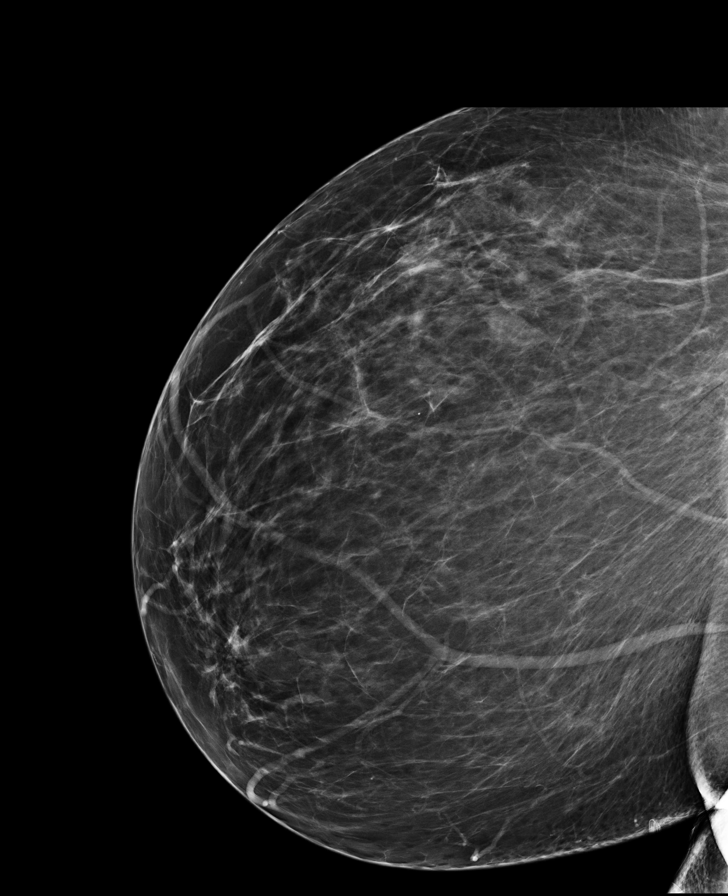

[R MLO (1 of 2)]
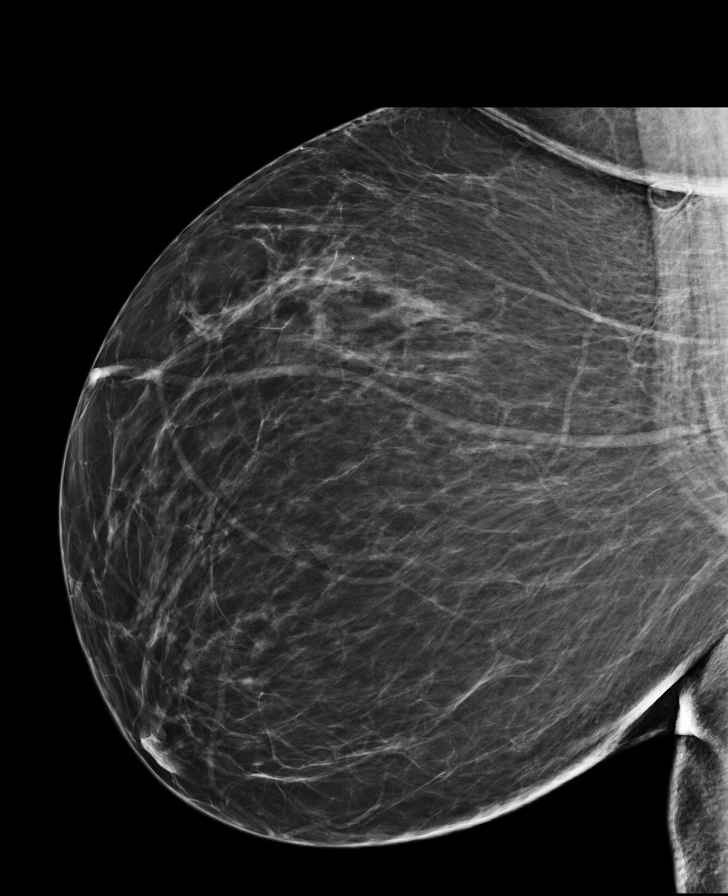

[R MLO (2 of 2)]
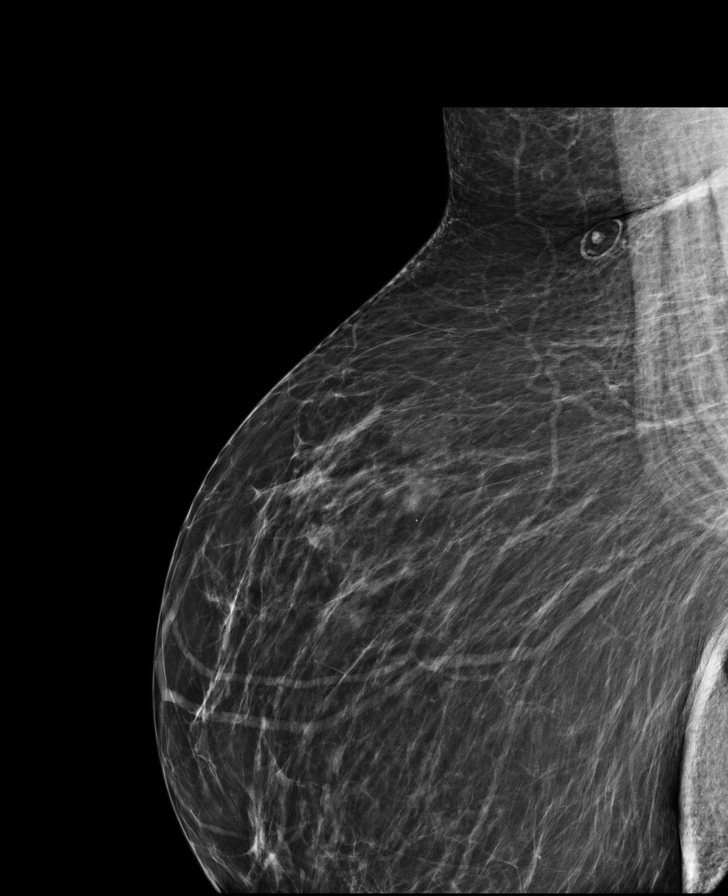

[L CC (2 of 2)]
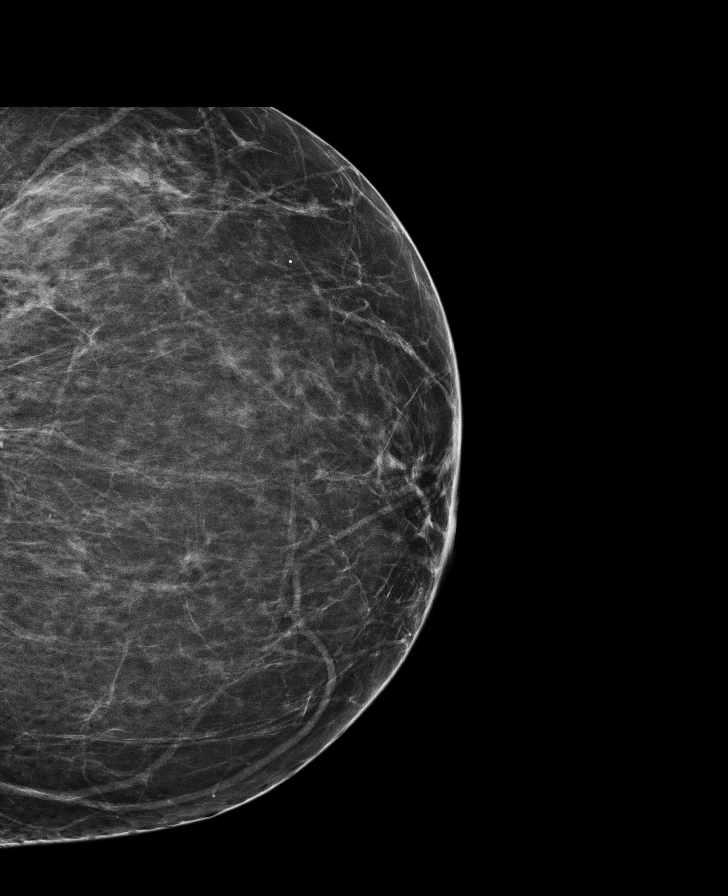

[R CC (2 of 2)]
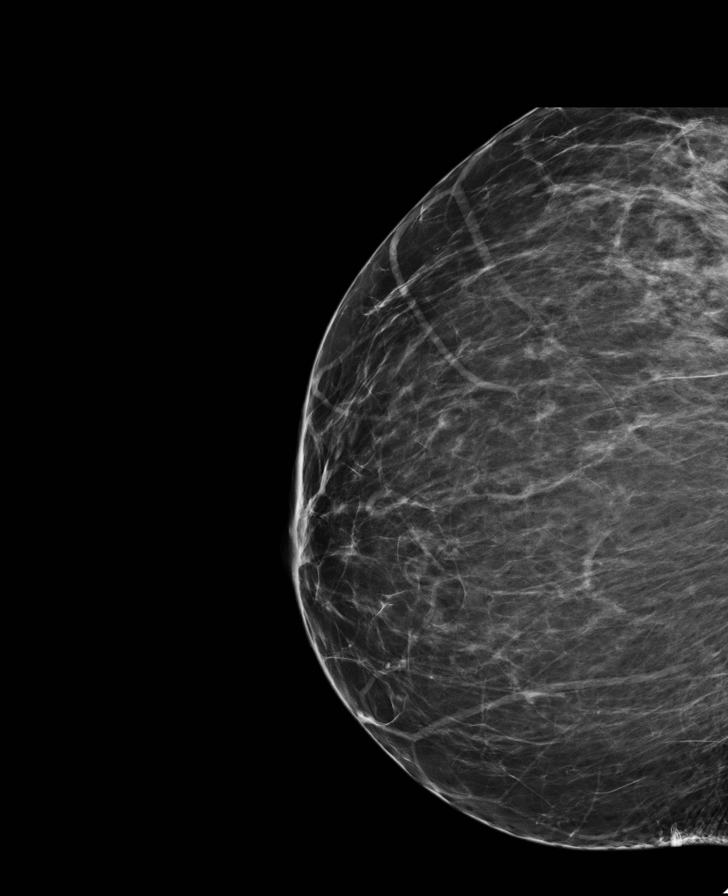

[8 of 9 positions shown; findings below may reference images not displayed]

ACR Breast Density Category b: There are scattered areas of
fibroglandular density.
FINDINGS: There are no findings suspicious for malignancy. The images were
evaluated with computer-aided detection.
IMPRESSION: No mammographic evidence of malignancy. A result letter of this
screening mammogram will be mailed directly to the patient.

RECOMMENDATION:
Screening mammogram in one year. (Code:QT-O-R3M)

BI-RADS CATEGORY  1: Negative.

## 2022-08-15 ENCOUNTER — Other Ambulatory Visit: Payer: Self-pay | Admitting: Nurse Practitioner

## 2022-08-15 DIAGNOSIS — Z1231 Encounter for screening mammogram for malignant neoplasm of breast: Secondary | ICD-10-CM

## 2022-09-06 ENCOUNTER — Ambulatory Visit
Admission: RE | Admit: 2022-09-06 | Discharge: 2022-09-06 | Disposition: A | Discharge status: Home / Self Care | Payer: Federal, State, Local not specified - PPO | Source: Ambulatory Visit | Attending: Nurse Practitioner | Admitting: Nurse Practitioner

## 2022-09-06 DIAGNOSIS — Z1231 Encounter for screening mammogram for malignant neoplasm of breast: Secondary | ICD-10-CM | POA: Diagnosis not present

## 2022-09-11 ENCOUNTER — Other Ambulatory Visit: Payer: Self-pay | Admitting: Nurse Practitioner

## 2022-09-11 DIAGNOSIS — R928 Other abnormal and inconclusive findings on diagnostic imaging of breast: Secondary | ICD-10-CM

## 2022-09-24 ENCOUNTER — Ambulatory Visit
Admission: RE | Admit: 2022-09-24 | Discharge: 2022-09-24 | Disposition: A | Discharge status: Home / Self Care | Payer: Federal, State, Local not specified - PPO | Source: Ambulatory Visit | Attending: Nurse Practitioner | Admitting: Nurse Practitioner

## 2022-09-24 ENCOUNTER — Other Ambulatory Visit: Payer: Self-pay | Admitting: Nurse Practitioner

## 2022-09-24 DIAGNOSIS — R928 Other abnormal and inconclusive findings on diagnostic imaging of breast: Secondary | ICD-10-CM

## 2022-09-24 DIAGNOSIS — R921 Mammographic calcification found on diagnostic imaging of breast: Secondary | ICD-10-CM

## 2022-10-04 ENCOUNTER — Ambulatory Visit
Admission: RE | Admit: 2022-10-04 | Discharge: 2022-10-04 | Disposition: A | Discharge status: Home / Self Care | Payer: Federal, State, Local not specified - PPO | Source: Ambulatory Visit | Attending: Nurse Practitioner | Admitting: Nurse Practitioner

## 2022-10-04 DIAGNOSIS — R928 Other abnormal and inconclusive findings on diagnostic imaging of breast: Secondary | ICD-10-CM

## 2022-10-04 DIAGNOSIS — N6012 Diffuse cystic mastopathy of left breast: Secondary | ICD-10-CM | POA: Diagnosis not present

## 2022-10-04 DIAGNOSIS — R921 Mammographic calcification found on diagnostic imaging of breast: Secondary | ICD-10-CM | POA: Diagnosis not present

## 2022-10-04 HISTORY — PX: BREAST BIOPSY: SHX20

## 2023-03-07 ENCOUNTER — Other Ambulatory Visit: Payer: Self-pay | Admitting: Nurse Practitioner

## 2023-03-07 DIAGNOSIS — R928 Other abnormal and inconclusive findings on diagnostic imaging of breast: Secondary | ICD-10-CM

## 2023-04-08 ENCOUNTER — Ambulatory Visit
Admission: RE | Admit: 2023-04-08 | Discharge: 2023-04-08 | Disposition: A | Discharge status: Home / Self Care | Payer: Federal, State, Local not specified - PPO | Source: Ambulatory Visit | Attending: Nurse Practitioner | Admitting: Nurse Practitioner

## 2023-04-08 DIAGNOSIS — R928 Other abnormal and inconclusive findings on diagnostic imaging of breast: Secondary | ICD-10-CM

## 2023-04-08 DIAGNOSIS — R921 Mammographic calcification found on diagnostic imaging of breast: Secondary | ICD-10-CM | POA: Diagnosis not present

## 2023-06-19 ENCOUNTER — Encounter: Payer: Self-pay | Admitting: Nurse Practitioner

## 2023-06-19 ENCOUNTER — Other Ambulatory Visit (HOSPITAL_COMMUNITY)
Admission: RE | Admit: 2023-06-19 | Discharge: 2023-06-19 | Disposition: A | Discharge status: Home / Self Care | Payer: Federal, State, Local not specified - PPO | Source: Ambulatory Visit | Attending: Nurse Practitioner | Admitting: Nurse Practitioner

## 2023-06-19 ENCOUNTER — Ambulatory Visit (INDEPENDENT_AMBULATORY_CARE_PROVIDER_SITE_OTHER): Payer: Federal, State, Local not specified - PPO | Admitting: Nurse Practitioner

## 2023-06-19 VITALS — BP 110/74 | HR 82 | Ht 65.75 in | Wt 284.0 lb

## 2023-06-19 DIAGNOSIS — Z01419 Encounter for gynecological examination (general) (routine) without abnormal findings: Secondary | ICD-10-CM | POA: Diagnosis not present

## 2023-06-19 DIAGNOSIS — Z780 Asymptomatic menopausal state: Secondary | ICD-10-CM

## 2023-06-19 DIAGNOSIS — R87810 Cervical high risk human papillomavirus (HPV) DNA test positive: Secondary | ICD-10-CM | POA: Insufficient documentation

## 2023-06-19 DIAGNOSIS — Z113 Encounter for screening for infections with a predominantly sexual mode of transmission: Secondary | ICD-10-CM | POA: Insufficient documentation

## 2023-06-19 DIAGNOSIS — Z124 Encounter for screening for malignant neoplasm of cervix: Secondary | ICD-10-CM | POA: Diagnosis not present

## 2023-06-19 DIAGNOSIS — Z1151 Encounter for screening for human papillomavirus (HPV): Secondary | ICD-10-CM | POA: Diagnosis not present

## 2023-06-19 DIAGNOSIS — Z1211 Encounter for screening for malignant neoplasm of colon: Secondary | ICD-10-CM

## 2023-06-19 NOTE — Progress Notes (Signed)
Kelly Vega 04-11-1971 [id number]   History:  52 y.o. [id number] presents today for annual exam. Postmenopausal - no HRT, no bleeding. 04/2020 ASCUS positive HR HPV negative 16/18/45, 04/2021 normal cytology + HR HPV with benign biopsy, 04/2022 normal neg HPV. Negative EMB 08/2021. HTN, OA managed by PCP. Benign left breast biopsy March 2024.  Gynecologic History Patient's last menstrual period was 06/03/2021.   Contraception/Family planning: abstinence Sexually active: Yes  Health Maintenance Last Pap: 05/08/2022. Results were: Normal neg HPV Last mammogram: 04/08/2023. Results were: Normal Last colonoscopy: Never. Negative Cologuard 04/2020 Last Dexa: Not indicated   Past medical history, past surgical history, family history and social history were all reviewed and documented in the EPIC chart. Divorced. Moved here from [place] to care for mother. 34 yo daughter in 9th grade, plays basketball. Works from home for [employer]. Mother with history of breast cancer at age 49.  ROS:  A ROS was performed and pertinent positives and negatives are included.  Exam:  Vitals:   06/19/23 1336  BP: 110/74  Pulse: 82  SpO2: 98%  Weight: 284 lb (128.8 kg)  Height: 5' 5.75" (1.67 m)      Body mass index is 46.19 kg/m.  General appearance:  Normal Thyroid:  Symmetrical, normal in size, without palpable masses or nodularity. Respiratory  Auscultation:  Clear without wheezing or rhonchi Cardiovascular  Auscultation:  Regular rate, without rubs, murmurs or gallops  Edema/varicosities:  Not grossly evident Abdominal  Soft,nontender, without masses, guarding or rebound.  Liver/spleen:  No organomegaly noted  Hernia:  None appreciated  Skin  Inspection:  Grossly normal   Breasts: Examined lying and sitting.   Right: Without masses, retractions, discharge or axillary adenopathy.   Left: Without masses, retractions, discharge or axillary adenopathy. Pelvic: External genitalia:  no lesions               Urethra:  normal appearing urethra with no masses, tenderness or lesions              Bartholins and Skenes: normal                 Vagina: normal appearing vagina with normal color and discharge, no lesions              Cervix: no lesions Bimanual Exam:  Uterus:  no masses or tenderness              Adnexa: no mass, fullness, tenderness              Rectovaginal: Deferred              Anus:  normal, no lesions  Patient informed chaperone available to be present for breast and pelvic exam. Patient has requested no chaperone to be present. Patient has been advised what will be completed during breast and pelvic exam.   Assessment/Plan:  52 y.o. [id number] for annual exam.   Well female exam with routine gynecological exam - Education provided on SBEs, importance of preventative screenings, current guidelines, high calcium diet, regular exercise, and multivitamin daily.  Labs with PCP.   Postmenopausal - No HRT, no bleeding. LMP 05/2021, mild menopausal symptoms.    Cervical cancer screening - Plan: Cytology - PAP( [place]). 04/2020 ASCUS positive HR HPV negative 16/18/45, 04/2021 normal cytology + HR HPV with benign biopsy, 04/2022 normal neg HPV.  Screening for colon cancer - Plan: Cologuard. Negative Cologuard 04/2020. Declines colonoscopy.   Screening examination for STD (sexually transmitted disease) -  Plan: Cytology - PAP( [place]). GC/CT/Trich added to pap.   Screening for breast cancer - Benign left breast biopsy March 2024. Normal breast exam today.  Return in about 1 year (around 06/18/2024) for Annual.      Olivia Mackie [facility], 2:00 PM 06/19/2023

## 2023-06-26 ENCOUNTER — Other Ambulatory Visit: Payer: Self-pay | Admitting: Nurse Practitioner

## 2023-06-26 DIAGNOSIS — B3731 Acute candidiasis of vulva and vagina: Secondary | ICD-10-CM

## 2023-06-26 LAB — CYTOLOGY - PAP
Adequacy: ABSENT
Chlamydia: NEGATIVE
Comment: NEGATIVE
Comment: NEGATIVE
Comment: NEGATIVE
Comment: NORMAL
Diagnosis: NEGATIVE
High risk HPV: POSITIVE — AB
Neisseria Gonorrhea: NEGATIVE
Trichomonas: NEGATIVE

## 2023-06-26 MED ORDER — FLUCONAZOLE 150 MG PO TABS: 150.0000 mg | ORAL_TABLET | ORAL | 0 refills | Status: DC

## 2023-07-10 LAB — COLOGUARD: COLOGUARD: NEGATIVE

## 2023-10-01 ENCOUNTER — Other Ambulatory Visit: Payer: Self-pay | Admitting: Nurse Practitioner

## 2023-10-01 DIAGNOSIS — Z1231 Encounter for screening mammogram for malignant neoplasm of breast: Secondary | ICD-10-CM

## 2023-10-09 ENCOUNTER — Ambulatory Visit
Admission: RE | Admit: 2023-10-09 | Discharge: 2023-10-09 | Disposition: A | Discharge status: Home / Self Care | Source: Ambulatory Visit | Attending: Nurse Practitioner

## 2023-10-09 DIAGNOSIS — Z1231 Encounter for screening mammogram for malignant neoplasm of breast: Secondary | ICD-10-CM | POA: Diagnosis not present

## 2024-06-23 ENCOUNTER — Ambulatory Visit: Payer: Federal, State, Local not specified - PPO | Admitting: Nurse Practitioner

## 2024-06-23 ENCOUNTER — Encounter: Payer: Self-pay | Admitting: Nurse Practitioner

## 2024-06-23 ENCOUNTER — Other Ambulatory Visit (HOSPITAL_COMMUNITY)
Admission: RE | Admit: 2024-06-23 | Discharge: 2024-06-23 | Disposition: A | Source: Ambulatory Visit | Attending: Nurse Practitioner | Admitting: Nurse Practitioner

## 2024-06-23 VITALS — BP 118/74 | HR 95 | Ht 66.0 in | Wt 291.0 lb

## 2024-06-23 DIAGNOSIS — Z1331 Encounter for screening for depression: Secondary | ICD-10-CM | POA: Diagnosis not present

## 2024-06-23 DIAGNOSIS — Z01419 Encounter for gynecological examination (general) (routine) without abnormal findings: Secondary | ICD-10-CM | POA: Diagnosis not present

## 2024-06-23 DIAGNOSIS — Z124 Encounter for screening for malignant neoplasm of cervix: Secondary | ICD-10-CM

## 2024-06-23 DIAGNOSIS — Z780 Asymptomatic menopausal state: Secondary | ICD-10-CM

## 2024-06-23 NOTE — Progress Notes (Signed)
 Kelly Vega [birth year] [id number]   History:  53 y.o. [id number] presents today for annual exam. Postmenopausal - no HRT, no bleeding. See pap history below. Negative EMB 08/2021. HTN, OA managed by PCP. Benign left breast biopsy March 2024.  04/2020 ASCUS positive HR HPV negative 16/18/45 04/2021 normal cytology + HR HPV with benign biopsy 04/2022 normal neg HPV 04/2023 normal + HR HPV  Gynecologic History Patient's last menstrual period was 06/03/2021.   Contraception/Family planning: abstinence Sexually active: Yes  Health Maintenance Last Pap: 05/19/2023. Results were: Normal + HR HPV Last mammogram: 10/09/2023. Results were: Normal Last colonoscopy: Never. Negative Cologuard 06/2023 Last Dexa: Not indicated      06/23/2024    2:39 PM  Depression screen PHQ 2/9  Decreased Interest 0  Down, Depressed, Hopeless 0  PHQ - 2 Score 0     Past medical history, past surgical history, family history and social history were all reviewed and documented in the EPIC chart. Boyfriend. Moved here from [place]  to care for mother. 94 yo daughter in 10th grade, plays basketball. Works from home for [employer]. Mother with history of breast cancer at age 68.  ROS:  A ROS was performed and pertinent positives and negatives are included.  Exam:  Vitals:   06/23/24 1434  BP: 118/74  Pulse: 95  SpO2: 98%  Weight: 291 lb (132 kg)  Height: 5' 6 (1.676 m)       Body mass index is 46.97 kg/m.  General appearance:  Normal Thyroid :  Symmetrical, normal in size, without palpable masses or nodularity. Respiratory  Auscultation:  Clear without wheezing or rhonchi Cardiovascular  Auscultation:  Regular rate, without rubs, murmurs or gallops  Edema/varicosities:  Not grossly evident Abdominal  Soft,nontender, without masses, guarding or rebound.  Liver/spleen:  No organomegaly noted  Hernia:  None appreciated  Skin  Inspection:  Grossly normal   Breasts: Examined lying and  sitting.   Right: Without masses, retractions, discharge or axillary adenopathy.   Left: Without masses, retractions, discharge or axillary adenopathy. Pelvic: External genitalia:  no lesions              Urethra:  normal appearing urethra with no masses, tenderness or lesions              Bartholins and Skenes: normal                 Vagina: normal appearing vagina with normal color and discharge, no lesions              Cervix: no lesions Bimanual Exam:  Uterus:  no masses or tenderness              Adnexa: no mass, fullness, tenderness              Rectovaginal: Deferred              Anus:  normal, no lesions  Kelly Vega, CMA present as chaperone.   Assessment/Plan:  53 y.o. [id number] for annual exam.   Well female exam with routine gynecological exam - Education provided on SBEs, importance of preventative screenings, current guidelines, high calcium diet, regular exercise, and multivitamin daily.  Labs with PCP.   Depression screening - PHQ - 0  Postmenopausal - No HRT, no bleeding. LMP 05/2021, mild menopausal symptoms.    Cervical cancer screening - Plan: Cytology - PAP( [place]). See HPI for pap history. Pap today per guidelines.   Screening for colon cancer - Negative Cologuard  06/2023  Screening for breast cancer - Benign left breast biopsy March 2024. Normal breast exam today. UTD on mammogram.   Return in about 1 year (around 06/23/2025) for Annual.      Kelly Vega [facility], 3:04 PM 06/23/2024

## 2024-06-25 ENCOUNTER — Ambulatory Visit: Payer: Self-pay | Admitting: Nurse Practitioner

## 2024-06-25 LAB — CYTOLOGY - PAP
Adequacy: ABSENT
Comment: NEGATIVE
Diagnosis: NEGATIVE
High risk HPV: NEGATIVE

## 2024-07-28 ENCOUNTER — Encounter (HOSPITAL_COMMUNITY): Payer: Self-pay

## 2024-07-28 ENCOUNTER — Emergency Department (HOSPITAL_COMMUNITY)
Admission: EM | Admit: 2024-07-28 | Discharge: 2024-07-29 | Disposition: A | Discharge status: Home / Self Care | Attending: Emergency Medicine | Admitting: Emergency Medicine

## 2024-07-28 ENCOUNTER — Emergency Department (HOSPITAL_COMMUNITY)

## 2024-07-28 ENCOUNTER — Other Ambulatory Visit: Payer: Self-pay

## 2024-07-28 DIAGNOSIS — I10 Essential (primary) hypertension: Secondary | ICD-10-CM | POA: Insufficient documentation

## 2024-07-28 DIAGNOSIS — M79602 Pain in left arm: Secondary | ICD-10-CM | POA: Insufficient documentation

## 2024-07-28 LAB — BASIC METABOLIC PANEL WITH GFR
Anion gap: 11 (ref 5–15)
BUN: 11 mg/dL (ref 6–20)
CO2: 25 mmol/L (ref 22–32)
Calcium: 9.2 mg/dL (ref 8.9–10.3)
Chloride: 103 mmol/L (ref 98–111)
Creatinine, Ser: 0.83 mg/dL (ref 0.44–1.00)
GFR, Estimated: >60 mL/min
Glucose, Bld: 87 mg/dL (ref 70–99)
Potassium: 3.7 mmol/L (ref 3.5–5.1)
Sodium: 139 mmol/L (ref 135–145)

## 2024-07-28 LAB — CBC
HCT: 43.2 % (ref 36.0–46.0)
Hemoglobin: 14.8 g/dL (ref 12.0–15.0)
MCH: 30.1 pg (ref 26.0–34.0)
MCHC: 34.3 g/dL (ref 30.0–36.0)
MCV: 87.8 fL (ref 80.0–100.0)
Platelets: 232 K/uL (ref 150–400)
RBC: 4.92 MIL/uL (ref 3.87–5.11)
RDW: 11.9 % (ref 11.5–15.5)
WBC: 9.8 K/uL (ref 4.0–10.5)
nRBC: 0 % (ref 0.0–0.2)

## 2024-07-28 LAB — TROPONIN T, HIGH SENSITIVITY
Troponin T High Sensitivity: <15 ng/L (ref 0–19)
Troponin T High Sensitivity: <15 ng/L (ref 0–19)

## 2024-07-28 NOTE — ED Provider Triage Note (Signed)
 Emergency Medicine Provider Triage Evaluation Note  Kelly Vega , a 54 y.o. female  was evaluated in triage.  Pt complains of left arm pain and swelling that began yesterday.  Notes the arm is throbbing.  States feels swollen.  Seen at urgent care and sent to ED for further evaluation.  Also notes the pain is taking her breath away and she feels short of breath.  She is not on medications as never had a blood clot previously.  Arm feels tingly.  Review of Systems  Positive: See above Negative: Chest pain, abdominal pain, nausea/vomit/diarrhea  Physical Exam  BP (!) 140/81 (BP Location: Right Arm)   Pulse 75   Temp 97.9 F (36.6 C)   Resp 20   Ht 5' 6 (1.676 m)   Wt 133.4 kg   LMP 06/03/2021   SpO2 95%   BMI 47.45 kg/m  Gen:   Awake, no distress   Resp:  Normal effort  MSK:   Moves extremities without difficulty  Other:  Tender to palpation in the left lower and upper arm.  Mild edema on the left compared to the right.  Radial pulses 2+ bilateral  Medical Decision Making  Medically screening exam initiated at 9:15 PM.  Appropriate orders placed.  Kelly Vega was informed that the remainder of the evaluation will be completed by another provider, this initial triage assessment does not replace that evaluation, and the importance of remaining in the ED until their evaluation is complete.     Neysa Thersia RAMAN, [place] 07/28/24 2119

## 2024-07-28 NOTE — ED Triage Notes (Addendum)
"   Patient states she woke up yesterday and her arm was throbbing. States she was driving to [facility] and her arm started becoming numb. This morning she still has the same throbbing pain. States her finger are so tingly and numb. Patient was taking tylenol  and ibuprofen for swelling. LKN was Sunday. Symptoms began around 0630 on Monday "

## 2024-07-28 NOTE — ED Triage Notes (Signed)
 Patient was at [facility] and was sent to ED for further evaluation of her left arm pain. She is also having chest pain that she states is taking her breath away. She states they couldn't do anything for her so they sent her here.  Pain is in left chest and radiates to left arm, into back, neck  and she has tingling in her hands.  Pain started yesterday and has maintained.   Denies cardiac history

## 2024-07-29 ENCOUNTER — Emergency Department (EMERGENCY_DEPARTMENT_HOSPITAL): Admit: 2024-07-29 | Discharge: 2024-07-29 | Disposition: A | Discharge status: Home / Self Care

## 2024-07-29 DIAGNOSIS — M79602 Pain in left arm: Secondary | ICD-10-CM | POA: Diagnosis not present

## 2024-07-29 MED ORDER — DEXAMETHASONE SOD PHOSPHATE PF 10 MG/ML IJ SOLN
10.0000 mg | Freq: Once | INTRAMUSCULAR | Status: AC
  Administered 2024-07-29: 10 mg via INTRAMUSCULAR
  Filled 2024-07-29: qty 1

## 2024-07-29 MED ORDER — HYDROCODONE-ACETAMINOPHEN 5-325 MG PO TABS: 1.0000 | ORAL_TABLET | Freq: Four times a day (QID) | ORAL | 0 refills | Status: AC | PRN

## 2024-07-29 MED ORDER — METHOCARBAMOL 500 MG PO TABS: 500.0000 mg | ORAL_TABLET | Freq: Two times a day (BID) | ORAL | 0 refills | Status: AC

## 2024-07-29 MED ORDER — KETOROLAC TROMETHAMINE 15 MG/ML IJ SOLN
15.0000 mg | Freq: Once | INTRAMUSCULAR | Status: AC
  Administered 2024-07-29: 15 mg via INTRAMUSCULAR
  Filled 2024-07-29: qty 1

## 2024-07-29 NOTE — Discharge Instructions (Signed)
 It was a pleasure taking care of you today.  As discussed, your workup is reassuring.  Ultrasound did not show evidence of a blood clot.  Your exam was not consistent with an infection.  I have included the number of the neurosurgeon.  Please call to schedule an appointment for further evaluation.  If you develop any weakness or worsening symptoms please return to the ER.  I am sending you home with pain medication and a muscle relaxer.  Both medications can cause drowsiness so do not drive or operate machinery while on the medication.  Please take over-the-counter ibuprofen or Tylenol  as needed for mild to moderate pain and save hydrocodone  for severe pain.  Return to the ER for any worsening symptoms.

## 2024-07-29 NOTE — Progress Notes (Signed)
 Left upper extremity venous duplex has been completed.  Results can be found in chart review under CV Proc.  07/29/2024 1:28 PM  [employer], RVT.

## 2024-07-29 NOTE — ED Provider Notes (Signed)
 " [place] [facility] Provider Note   CSN: [id number] Arrival date & time: 07/28/24  2011     Patient presents with: Arm Pain and Numbness   Kelly Vega is a 54 y.o. female with a past medical history significant for hypertension and vitamin D deficiency who presents to the ED due to left upper extremity pain x 2 days.  No known injury.  Patient states pain radiates throughout her entire left upper extremity.  Also admits to some numbness/tingling to left fingertips.  Denies any difficulties grasping items.  Admits to some weakness secondary to pain.  No fever or chills.  Denies any neck pain.  Also endorses some chest pain which she believes is coming from her left arm.  Admits to a throbbing sensation.  No cardiac history.  Denies associated shortness of breath.  No lower extremity edema.  No color changes to left upper extremity.  History obtained from patient and past medical records. No interpreter used during encounter.       Prior to Admission medications  Medication Sig Start Date End Date Taking? Authorizing Provider  HYDROcodone -acetaminophen  (NORCO/VICODIN) 5-325 MG tablet Take 1 tablet by mouth every 6 (six) hours as needed. 07/29/24  Yes Yenesis Even, Aleck BROCKS, PA-C  methocarbamol  (ROBAXIN ) 500 MG tablet Take 1 tablet (500 mg total) by mouth 2 (two) times daily. 07/29/24  Yes Rondel Episcopo C, PA-C  Acetaminophen  (TYLENOL  ARTHRITIS EXT RELIEF PO) Take by mouth.    [provider]  IBUPROFEN PO Take by mouth.    [provider]    Allergies: Patient has no known allergies.    Review of Systems  Respiratory:  Negative for shortness of breath.   Cardiovascular:  Positive for chest pain.  Musculoskeletal:  Positive for arthralgias. Negative for neck pain.  Neurological:  Positive for numbness.    Updated Vital Signs BP (!) 156/124 (BP Location: Right Arm)   Pulse 82   Temp 98 F (36.7 C)   Resp 15   Ht 5' 6 (1.676  m)   Wt 133.4 kg   LMP 06/03/2021   SpO2 99%   BMI 47.45 kg/m   Physical Exam Vitals and nursing note reviewed.  Constitutional:      General: She is not in acute distress.    Appearance: She is not ill-appearing.  HENT:     Head: Normocephalic.  Eyes:     Pupils: Pupils are equal, round, and reactive to light.  Neck:     Comments: No cervical midline tenderness. Cardiovascular:     Rate and Rhythm: Normal rate and regular rhythm.     Pulses: Normal pulses.     Heart sounds: Normal heart sounds. No murmur heard.    No friction rub. No gallop.  Pulmonary:     Effort: Pulmonary effort is normal.     Breath sounds: Normal breath sounds.  Abdominal:     General: Abdomen is flat. There is no distension.     Palpations: Abdomen is soft.     Tenderness: There is no abdominal tenderness. There is no guarding or rebound.  Musculoskeletal:        General: Normal range of motion.     Cervical back: Neck supple.     Comments: No tenderness throughout left upper extremity.  No edema.  Soft compartments.  Radial pulse intact.  Full range of motion of left shoulder, elbow, and wrist.  Full range of motion of all left fingers.  Skin:    General: Skin is warm and dry.  Neurological:     General: No focal deficit present.     Mental Status: She is alert.  Psychiatric:        Mood and Affect: Mood normal.        Behavior: Behavior normal.     (all labs ordered are listed, but only abnormal results are displayed) Labs Reviewed  BASIC METABOLIC PANEL WITH GFR  CBC  TROPONIN T, HIGH SENSITIVITY  TROPONIN T, HIGH SENSITIVITY    EKG: None  Radiology: UE Venous Duplex ([facility] and [facility] ONLY) Result Date: 07/29/2024 UPPER VENOUS STUDY  Patient Name:  Kelly Vega  Date of Exam:   07/29/2024 Medical Rec #: [id number]        Accession #:    [id number] Date of Birth: 27-Apr-1971         Patient Gender: F Patient Age:   76 years Exam Location:  [facility] Procedure:      VAS US  UPPER  EXTREMITY VENOUS DUPLEX Referring Phys: ALEXIS YOUNG --------------------------------------------------------------------------------  Indications: Edema, Pain, and chest pain Comparison Study: No prior exam. Performing Technologist: Edilia Elden Appl  Examination Guidelines: A complete evaluation includes B-mode imaging, spectral Doppler, color Doppler, and power Doppler as needed of all accessible portions of each vessel. Bilateral testing is considered an integral part of a complete examination. Limited examinations for reoccurring indications may be performed as noted.  Right Findings: +----------+------------+---------+-----------+----------+-------+ RIGHT     CompressiblePhasicitySpontaneousPropertiesSummary +----------+------------+---------+-----------+----------+-------+ IJV           Full       Yes       Yes                      +----------+------------+---------+-----------+----------+-------+ Subclavian    Full       Yes       Yes                      +----------+------------+---------+-----------+----------+-------+  Left Findings: +----------+------------+---------+-----------+----------+-------+ LEFT      CompressiblePhasicitySpontaneousPropertiesSummary +----------+------------+---------+-----------+----------+-------+ IJV           Full       Yes       Yes                      +----------+------------+---------+-----------+----------+-------+ Subclavian    Full       Yes       Yes                      +----------+------------+---------+-----------+----------+-------+ Axillary      Full       Yes       Yes                      +----------+------------+---------+-----------+----------+-------+ Brachial      Full       Yes       Yes                      +----------+------------+---------+-----------+----------+-------+ Radial        Full                                           +----------+------------+---------+-----------+----------+-------+ Ulnar         Full                                          +----------+------------+---------+-----------+----------+-------+  Cephalic      Full       Yes       Yes                      +----------+------------+---------+-----------+----------+-------+ Basilic       Full       Yes       Yes                      +----------+------------+---------+-----------+----------+-------+  Summary:  Right: No evidence of thrombosis in the subclavian.  Left: No evidence of deep vein thrombosis in the upper extremity. No evidence of superficial vein thrombosis in the upper extremity.  *See table(s) above for measurements and observations.    Preliminary    DG Chest 2 View Result Date: 07/28/2024 EXAM: 2 VIEW(S) XRAY OF THE CHEST 07/28/2024 08:48:00 PM COMPARISON: None available. CLINICAL HISTORY: cp FINDINGS: LUNGS AND PLEURA: No focal pulmonary opacity. No pleural effusion. No pneumothorax. HEART AND MEDIASTINUM: No acute abnormality of the cardiac and mediastinal silhouettes. BONES AND SOFT TISSUES: Multilevel degenerative changes of thoracic spine. IMPRESSION: 1. No acute cardiopulmonary abnormality. 2. 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 Electronically signed by: Greig Pique MD 07/28/2024 08:59 PM EST RP Workstation: [id number]     Procedures   Medications Ordered in the ED  dexamethasone  (DECADRON ) injection 10 mg (has no administration in time range)  ketorolac  (TORADOL ) 15 MG/ML injection 15 mg (has no administration in time range)                                    Medical Decision Making Amount and/or Complexity of Data Reviewed Labs: ordered. Decision-making details documented in ED Course. Radiology: ordered and independent interpretation performed. Decision-making details documented in ED Course. ECG/medicine tests: ordered and independent interpretation performed. Decision-making details  documented in ED Course.  Risk Prescription drug management.   This patient presents to the ED for concern of LUE pain, this involves an extensive number of treatment options, and is a complaint that carries with it a high risk of complications and morbidity.  The differential diagnosis includes cervical radiculopathy, arterial occlusion, central cord compression, bony fracture, cellulitis, DVT, etc  54 year old female presents to the ED due to left upper extremity pain x 2 days.  No known injury.  Denies neck pain.  Admits to some numbness/tingling to left fingertips.  Also admits to some chest pain which she believes is coming from her left arm.  No cardiac history.  Upon arrival, stable vitals.  Patient is afebrile, not tachycardic or hypoxic.  Unfortunately patient waited over 17 hours prior to my initial evaluation.  Reassuring physical exam.  No tenderness throughout left upper extremity.  Left upper extremity neurovascularly intact with soft compartments.  Low suspicion for compartment syndrome.  No cervical midline tenderness.  Equal grip strength.  Low suspicion for central cord compression.  Routine labs ordered in triage.  Ultrasound ordered to rule out DVT. No signs of cellulitis on exam. Possible cervical radiculopathy. No history of DM. Decadron  and Toradol  given. No injury to suggest bony fracture, so will hold x-rays.   CBC with no leukocytosis.  Normal hemoglobin.  BMP unremarkable.  Normal renal function.  No major electrolyte derangements.  Troponin x 2 normal.  EKG normal sinus rhythm.  No signs of acute ischemia.  Low suspicion for ACS.  Chest x-ray personally viewed  and interpreted negative for any acute abnormalities.  Does demonstrate multilevel degenerative changes of the thoracic spine.  Ultrasound negative for DVT.   Patient has equal grip strength and no weakness appreciated on exam so do not feel emergent cervical spine MRI is needed at this time.  Low suspicion for central  cord compression. No lower extremity symptoms. Patient treated with Decadron  and Toradol  for possible cervical radiculopathy.  No evidence of infection on exam. Good pulse, low suspicion for arterial occlusion. Neurosurgery number given at discharge and advised to call to schedule an appointment.  Patient discharged with pain medication and muscle relaxers.  Patient stable for discharge. Strict ED precautions discussed with patient. Patient states understanding and agrees to plan. Patient discharged home in no acute distress and stable vitals  Hx HTN Lives independently     Final diagnoses:  Left arm pain    ED Discharge Orders          Ordered    HYDROcodone -acetaminophen  (NORCO/VICODIN) 5-325 MG tablet  Every 6 hours PRN        07/29/24 1425    methocarbamol  (ROBAXIN ) 500 MG tablet  2 times daily        07/29/24 1425               Lorelle Aleck BROCKS, PA-C 07/29/24 1427    Mannie Pac T, DO 08/01/24 [phone]  "

## 2024-07-29 NOTE — ED Notes (Signed)
 Pt provided with discharge and follow up instructions, medications discussed, pt verbalized understanding. VSS, pt ambulatory out of ED w/ all paperwork and belongings in NAD.

## 2024-08-10 ENCOUNTER — Other Ambulatory Visit (HOSPITAL_COMMUNITY): Payer: Self-pay | Admitting: Neurosurgery

## 2024-08-10 DIAGNOSIS — M4722 Other spondylosis with radiculopathy, cervical region: Secondary | ICD-10-CM

## 2024-08-17 ENCOUNTER — Ambulatory Visit (HOSPITAL_COMMUNITY)
Admission: RE | Admit: 2024-08-17 | Discharge: 2024-08-17 | Disposition: A | Discharge status: Home / Self Care | Source: Ambulatory Visit | Attending: Neurosurgery | Admitting: Neurosurgery

## 2024-08-17 DIAGNOSIS — M4722 Other spondylosis with radiculopathy, cervical region: Secondary | ICD-10-CM | POA: Insufficient documentation

## 2025-06-24 ENCOUNTER — Ambulatory Visit: Admitting: Nurse Practitioner
